# Patient Record
Sex: Female | Born: 1980
Health system: Southern US, Community
[De-identification: ages and names within clinical notes are randomized; demographics above are authoritative.]

## PROBLEM LIST (undated history)

## (undated) DIAGNOSIS — Z8279 Family history of other congenital malformations, deformations and chromosomal abnormalities: Secondary | ICD-10-CM

## (undated) DIAGNOSIS — I499 Cardiac arrhythmia, unspecified: Secondary | ICD-10-CM

## (undated) DIAGNOSIS — F419 Anxiety disorder, unspecified: Secondary | ICD-10-CM

## (undated) DIAGNOSIS — K602 Anal fissure, unspecified: Secondary | ICD-10-CM

## (undated) DIAGNOSIS — G8929 Other chronic pain: Secondary | ICD-10-CM

## (undated) DIAGNOSIS — Z973 Presence of spectacles and contact lenses: Secondary | ICD-10-CM

## (undated) DIAGNOSIS — K625 Hemorrhage of anus and rectum: Secondary | ICD-10-CM

## (undated) DIAGNOSIS — D229 Melanocytic nevi, unspecified: Secondary | ICD-10-CM

## (undated) DIAGNOSIS — O99340 Other mental disorders complicating pregnancy, unspecified trimester: Secondary | ICD-10-CM

## (undated) DIAGNOSIS — Z8619 Personal history of other infectious and parasitic diseases: Secondary | ICD-10-CM

## (undated) DIAGNOSIS — Z8611 Personal history of tuberculosis: Secondary | ICD-10-CM

## (undated) HISTORY — DX: Cardiac arrhythmia, unspecified: I49.9

## (undated) HISTORY — DX: Hemorrhage of anus and rectum: K62.5

## (undated) HISTORY — PX: OTHER SURGICAL HISTORY: SHX169

## (undated) HISTORY — DX: Personal history of other infectious and parasitic diseases: Z86.19

## (undated) HISTORY — DX: Other mental disorders complicating pregnancy, unspecified trimester: O99.340

## (undated) HISTORY — DX: Personal history of tuberculosis: Z86.11

## (undated) HISTORY — PX: CYST EXCISION: SHX5701

## (undated) HISTORY — PX: NO PAST SURGERIES: SHX2092

## (undated) HISTORY — DX: Anxiety disorder, unspecified: F41.9

## (undated) HISTORY — PX: COLONOSCOPY: SHX174

## (undated) HISTORY — DX: Family history of other congenital malformations, deformations and chromosomal abnormalities: Z82.79

---
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
## 2008-04-02 DIAGNOSIS — I499 Cardiac arrhythmia, unspecified: Secondary | ICD-10-CM

## 2008-04-02 DIAGNOSIS — Z8679 Personal history of other diseases of the circulatory system: Secondary | ICD-10-CM

## 2008-04-02 HISTORY — DX: Personal history of other diseases of the circulatory system: Z86.79

## 2008-04-02 HISTORY — PX: COLONOSCOPY: SHX174

## 2008-04-02 HISTORY — DX: Cardiac arrhythmia, unspecified: I49.9

## 2011-02-27 ENCOUNTER — Other Ambulatory Visit: Payer: Self-pay | Admitting: Obstetrics and Gynecology

## 2011-02-28 ENCOUNTER — Encounter (HOSPITAL_COMMUNITY): Payer: Self-pay | Admitting: *Deleted

## 2011-02-28 ENCOUNTER — Encounter (HOSPITAL_COMMUNITY): Payer: Self-pay | Admitting: Pharmacist

## 2011-03-02 ENCOUNTER — Ambulatory Visit (HOSPITAL_COMMUNITY): Payer: BC Managed Care – PPO | Admitting: Anesthesiology

## 2011-03-02 ENCOUNTER — Encounter (HOSPITAL_COMMUNITY): Payer: Self-pay | Admitting: *Deleted

## 2011-03-02 ENCOUNTER — Encounter (HOSPITAL_COMMUNITY): Payer: Self-pay | Admitting: Obstetrics and Gynecology

## 2011-03-02 ENCOUNTER — Ambulatory Visit (HOSPITAL_COMMUNITY)
Admission: RE | Admit: 2011-03-02 | Discharge: 2011-03-02 | Disposition: A | Discharge status: Home / Self Care | Payer: BC Managed Care – PPO | Source: Ambulatory Visit | Attending: Obstetrics and Gynecology | Admitting: Obstetrics and Gynecology

## 2011-03-02 ENCOUNTER — Other Ambulatory Visit: Payer: Self-pay | Admitting: Obstetrics and Gynecology

## 2011-03-02 ENCOUNTER — Encounter (HOSPITAL_COMMUNITY): Payer: Self-pay | Admitting: Anesthesiology

## 2011-03-02 ENCOUNTER — Ambulatory Visit (HOSPITAL_COMMUNITY): Payer: BC Managed Care – PPO

## 2011-03-02 ENCOUNTER — Encounter (HOSPITAL_COMMUNITY)
Admission: RE | Disposition: A | Discharge status: Home / Self Care | Payer: Self-pay | Source: Ambulatory Visit | Attending: Obstetrics and Gynecology

## 2011-03-02 DIAGNOSIS — O021 Missed abortion: Secondary | ICD-10-CM | POA: Diagnosis present

## 2011-03-02 HISTORY — PX: DILATION AND EVACUATION: SHX1459

## 2011-03-02 SURGERY — DILATION AND EVACUATION, UTERUS
Anesthesia: Monitor Anesthesia Care | Site: Vagina | Wound class: Clean Contaminated

## 2011-03-02 MED ORDER — KETOROLAC TROMETHAMINE 60 MG/2ML IM SOLN
INTRAMUSCULAR | Status: AC
  Filled 2011-03-02: qty 2

## 2011-03-02 MED ORDER — KETOROLAC TROMETHAMINE 60 MG/2ML IM SOLN
INTRAMUSCULAR | Status: DC | PRN
  Administered 2011-03-02: 30 mg via INTRAMUSCULAR

## 2011-03-02 MED ORDER — IBUPROFEN 200 MG PO TABS: 600.0000 mg | ORAL_TABLET | Freq: Four times a day (QID) | ORAL | Status: AC

## 2011-03-02 MED ORDER — DEXAMETHASONE SODIUM PHOSPHATE 10 MG/ML IJ SOLN
INTRAMUSCULAR | Status: AC
  Filled 2011-03-02: qty 1

## 2011-03-02 MED ORDER — PROPOFOL 10 MG/ML IV EMUL
INTRAVENOUS | Status: AC
  Filled 2011-03-02: qty 50

## 2011-03-02 MED ORDER — LIDOCAINE HCL (CARDIAC) 20 MG/ML IV SOLN
INTRAVENOUS | Status: AC
  Filled 2011-03-02: qty 5

## 2011-03-02 MED ORDER — FENTANYL CITRATE 0.05 MG/ML IJ SOLN
INTRAMUSCULAR | Status: AC
  Filled 2011-03-02: qty 2

## 2011-03-02 MED ORDER — LIDOCAINE HCL (CARDIAC) 20 MG/ML IV SOLN
INTRAVENOUS | Status: DC | PRN
  Administered 2011-03-02: 80 mg via INTRAVENOUS

## 2011-03-02 MED ORDER — ONDANSETRON HCL 4 MG/2ML IJ SOLN
INTRAMUSCULAR | Status: DC | PRN
  Administered 2011-03-02: 4 mg via INTRAVENOUS

## 2011-03-02 MED ORDER — METHYLERGONOVINE MALEATE 0.2 MG/ML IJ SOLN
INTRAMUSCULAR | Status: DC | PRN
  Administered 2011-03-02: 0.2 mg via INTRAMUSCULAR

## 2011-03-02 MED ORDER — ONDANSETRON HCL 4 MG/2ML IJ SOLN
INTRAMUSCULAR | Status: AC
  Filled 2011-03-02: qty 2

## 2011-03-02 MED ORDER — DEXAMETHASONE SODIUM PHOSPHATE 10 MG/ML IJ SOLN
INTRAMUSCULAR | Status: DC | PRN
  Administered 2011-03-02: 10 mg via INTRAVENOUS

## 2011-03-02 MED ORDER — HYDROCODONE-ACETAMINOPHEN 5-500 MG PO TABS: 1.0000 | ORAL_TABLET | Freq: Four times a day (QID) | ORAL | Status: AC | PRN

## 2011-03-02 MED ORDER — GLYCOPYRROLATE 0.2 MG/ML IJ SOLN
INTRAMUSCULAR | Status: DC | PRN
  Administered 2011-03-02: 0.1 mg via INTRAVENOUS

## 2011-03-02 MED ORDER — HYDROMORPHONE HCL PF 1 MG/ML IJ SOLN: 0.2500 mg | INTRAMUSCULAR | Status: DC | PRN

## 2011-03-02 MED ORDER — KETOROLAC TROMETHAMINE 30 MG/ML IJ SOLN
INTRAMUSCULAR | Status: DC | PRN
  Administered 2011-03-02: 30 mg via INTRAVENOUS

## 2011-03-02 MED ORDER — MIDAZOLAM HCL 2 MG/2ML IJ SOLN
INTRAMUSCULAR | Status: AC
  Filled 2011-03-02: qty 2

## 2011-03-02 MED ORDER — LACTATED RINGERS IV SOLN
INTRAVENOUS | Status: DC
  Administered 2011-03-02 (×2): via INTRAVENOUS

## 2011-03-02 MED ORDER — PROPOFOL 10 MG/ML IV EMUL
INTRAVENOUS | Status: DC | PRN
  Administered 2011-03-02 (×3): 30 mg via INTRAVENOUS
  Administered 2011-03-02: 10 mg via INTRAVENOUS
  Administered 2011-03-02 (×2): 20 mg via INTRAVENOUS

## 2011-03-02 MED ORDER — MIDAZOLAM HCL 5 MG/5ML IJ SOLN
INTRAMUSCULAR | Status: DC | PRN
  Administered 2011-03-02: 2 mg via INTRAVENOUS

## 2011-03-02 MED ORDER — METHYLERGONOVINE MALEATE 0.2 MG/ML IJ SOLN
INTRAMUSCULAR | Status: AC
  Filled 2011-03-02: qty 1

## 2011-03-02 MED ORDER — KETOROLAC TROMETHAMINE 30 MG/ML IJ SOLN: 15.0000 mg | Freq: Once | INTRAMUSCULAR | Status: DC | PRN

## 2011-03-02 MED ORDER — GLYCOPYRROLATE 0.2 MG/ML IJ SOLN
INTRAMUSCULAR | Status: AC
  Filled 2011-03-02: qty 2

## 2011-03-02 MED ORDER — ONDANSETRON HCL 4 MG/2ML IJ SOLN: 4.0000 mg | Freq: Once | INTRAMUSCULAR | Status: DC | PRN

## 2011-03-02 MED ORDER — FENTANYL CITRATE 0.05 MG/ML IJ SOLN
INTRAMUSCULAR | Status: DC | PRN
  Administered 2011-03-02 (×2): 50 ug via INTRAVENOUS
  Administered 2011-03-02: 100 ug via INTRAVENOUS

## 2011-03-02 MED ORDER — LIDOCAINE HCL 2 % IJ SOLN
INTRAMUSCULAR | Status: DC | PRN
  Administered 2011-03-02: 10 mL

## 2011-03-02 SURGICAL SUPPLY — 18 items
CATH ROBINSON RED A/P 16FR (CATHETERS) ×2 IMPLANT
CLOTH BEACON ORANGE TIMEOUT ST (SAFETY) ×2 IMPLANT
DECANTER SPIKE VIAL GLASS SM (MISCELLANEOUS) ×2 IMPLANT
DILATOR CANAL MILEX (MISCELLANEOUS) IMPLANT
GLOVE SURG SS PI 6.5 STRL IVOR (GLOVE) ×4 IMPLANT
GOWN PREVENTION PLUS LG XLONG (DISPOSABLE) ×2 IMPLANT
KIT BERKELEY 1ST TRIMESTER 3/8 (MISCELLANEOUS) ×2 IMPLANT
NEEDLE SPNL 22GX3.5 QUINCKE BK (NEEDLE) ×2 IMPLANT
NS IRRIG 1000ML POUR BTL (IV SOLUTION) ×2 IMPLANT
PACK VAGINAL MINOR WOMEN LF (CUSTOM PROCEDURE TRAY) ×2 IMPLANT
PAD PREP 24X48 CUFFED NSTRL (MISCELLANEOUS) ×2 IMPLANT
SET BERKELEY SUCTION TUBING (SUCTIONS) ×2 IMPLANT
SYR CONTROL 10ML LL (SYRINGE) ×2 IMPLANT
TOWEL OR 17X24 6PK STRL BLUE (TOWEL DISPOSABLE) ×4 IMPLANT
VACURETTE 10 RIGID CVD (CANNULA) IMPLANT
VACURETTE 7MM CVD STRL WRAP (CANNULA) IMPLANT
VACURETTE 8 RIGID CVD (CANNULA) ×2 IMPLANT
VACURETTE 9 RIGID CVD (CANNULA) IMPLANT

## 2011-03-02 NOTE — OR Nursing (Signed)
1245 Husband notified of late OR start per Dr. Pennie Rushing via volunteer.

## 2011-03-02 NOTE — Anesthesia Postprocedure Evaluation (Signed)
Anesthesia Post Note  Patient: Elaine Black  Procedure(s) Performed:  DILATATION AND EVACUATION (D&E) - With intraoperative ultrasound  Anesthesia type: MAC  Patient location: PACU  Post pain: Pain level controlled  Post assessment: Post-op Vital signs reviewed  Last Vitals:  Filed Vitals:   03/02/11 1315  BP: 112/65  Pulse: 85  Temp:   Resp: 18    Post vital signs: Reviewed  Level of consciousness: sedated  Complications: No apparent anesthesia complications

## 2011-03-02 NOTE — Transfer of Care (Signed)
Immediate Anesthesia Transfer of Care Note  Patient: Elaine Black  Procedure(s) Performed:  DILATATION AND EVACUATION (D&E) - With intraoperative ultrasound  Patient Location: PACU  Anesthesia Type: MAC  Level of Consciousness: awake and sedated  Airway & Oxygen Therapy: Patient Spontanous Breathing and Patient connected to nasal cannula oxygen  Post-op Assessment: Report given to PACU RN and Post -op Vital signs reviewed and stable  Post vital signs: Reviewed and stable  Complications: No apparent anesthesia complications

## 2011-03-02 NOTE — Op Note (Addendum)
03/02/2011  1:04 PM  PATIENT:  Elaine Black  30 y.o. female  PRE-OPERATIVE DIAGNOSIS:  missed abortion  POST-OPERATIVE DIAGNOSIS:  missed abortion  PROCEDURE:  Procedure(s): DILATATION AND EVACUATION (D&E)  SURGEON:  Surgeon(s): Hal Morales, MD  ASSISTANTS: none   ANESTHESIA:   IV sedation and paracervical block  ESTIMATED BLOOD LOSS: <50cc COMPLICATIONS: none  FINDINGS:Uterus 10 weeks size moderate amt tissue obtained  BLOOD ADMINISTERED:none  LOCAL MEDICATIONS USED:  LIDOCAINE 10 CC  SPECIMEN:  Source of Specimen:  products of conception  DISPOSITION OF SPECIMEN:  PATHOLOGY  COUNTS:  YES  DESCRIPTION OF PROCEDURE:  The patient was taken the operating room after appropriate identification placed on the operating table. After the attainment of adequate anesthesia she was placed in the lithotomy position. Perineum and vagina were prepped with multiple areas of Betadine and a straight in and out catheter used to empty the bladder. The perineum was draped as a sterile field. A weighted speculum was placed in the posterior vagina and a paracervical block achieved a total of 10 cc of 2% Xylocaine and the 5 and 7:00 positions. A single-tooth tenaculum was placed on the anterior cervix and the cervix dilated to accommodate a number 8 suction catheter. The suction catheter was then used to suction evacuate all quadrants of the uterus. A sharp curet was used to ensure that all products of conception had been removed. All instruments were then removed from the vagina and the patient had her anesthetic reversed and was taken to the recovery room in satisfactory condition having tolerated the procedure well sponge and instrument counts correct.  PLAN OF CARE: Discharge home  PATIENT DISPOSITION:  PACU - hemodynamically stable.   Delay start of Pharmacological VTE agent (>24hrs) due to surgical blood loss or risk of bleeding:  not applicable    HAYGOOD,VANESSA P,  MD 1:04 PM          Blood type A positive

## 2011-03-02 NOTE — H&P (Signed)
Elaine Black is an 30 y.o. female. For treatment of missed abortion  Pertinent Gynecological History: Menses: flow is moderate Bleeding: none since positive pregnancy test Contraception: none DES exposure: unknown Blood transfusions: none Sexually transmitted diseases: no past history Previous GYN Procedures: none\  Last mammogram: na Date:  Last pap: normal Date: not recorded OB History: G1, P0  Menstrual History: Menarche age: 13Patient's last menstrual period was 12/14/2010.    History reviewed. No pertinent past medical history.Benign Juvenile polyps  Past Surgical History  Procedure Date  . Colonoscopy   . Wisdom tooth extraction   . Left ear     removed benign cyst    History reviewed. No pertinent family history.  Social History:  reports that she has never smoked. She has never used smokeless tobacco. She reports that she drinks alcohol. She reports that she does not use illicit drugs.  Allergies:  Allergies  Allergen Reactions  . Demerol Hives    Prescriptions prior to admission  Medication Sig Dispense Refill  . prenatal vitamin w/FE, FA (PRENATAL 1 + 1) 27-1 MG TABS Take 1 tablet by mouth daily.          ROS  Blood pressure 109/76, pulse 92, temperature 98.4 F (36.9 C), temperature source Oral, resp. rate 18, height 5\' 11"  (1.803 m), weight 65.318 kg (144 lb), last menstrual period 12/14/2010, SpO2 100.00%. Physical Exam  Constitutional: She appears well-developed and well-nourished.  Neck: Normal range of motion. Neck supple.  Cardiovascular: Normal rate and regular rhythm.   Respiratory: Effort normal and breath sounds normal.  GI: Soft. There is no tenderness.  Genitourinary: Vagina normal.         No results found for this or any previous visit (from the past 24 hour(s)). Uterus 10 weeeks size  No results found. Ultrasound 11/27 No FHT and no FHM Assessment/Plan: Missed abortion Patient requests D&E  Elaine Black  P 03/02/2011, 11:40 AM

## 2011-03-02 NOTE — Anesthesia Preprocedure Evaluation (Signed)
Anesthesia Evaluation  Patient identified by MRN, date of birth, ID band Patient awake    Reviewed: Allergy & Precautions, H&P , NPO status , Patient's Chart, lab work & pertinent test results  Airway Mallampati: I      Dental No notable dental hx.    Pulmonary neg pulmonary ROS,    Pulmonary exam normal       Cardiovascular neg cardio ROS     Neuro/Psych Negative Neurological ROS  Negative Psych ROS   GI/Hepatic negative GI ROS, Neg liver ROS,   Endo/Other  Negative Endocrine ROS  Renal/GU negative Renal ROS  Genitourinary negative   Musculoskeletal negative musculoskeletal ROS (+)   Abdominal Normal abdominal exam  (+)   Peds negative pediatric ROS (+)  Hematology negative hematology ROS (+)   Anesthesia Other Findings   Reproductive/Obstetrics negative OB ROS                           Anesthesia Physical Anesthesia Plan  ASA: II  Anesthesia Plan: MAC   Post-op Pain Management:    Induction:   Airway Management Planned:   Additional Equipment:   Intra-op Plan:   Post-operative Plan:   Informed Consent: I have reviewed the patients History and Physical, chart, labs and discussed the procedure including the risks, benefits and alternatives for the proposed anesthesia with the patient or authorized representative who has indicated his/her understanding and acceptance.     Plan Discussed with: CRNA  Anesthesia Plan Comments:         Anesthesia Quick Evaluation

## 2011-03-02 NOTE — OR Nursing (Signed)
Intraoperative vaginal  ultrasound started @ 1233. Ended @1236  w/ Dr. Pennie Rushing present and informing pt. Of results.

## 2011-03-02 NOTE — OR Nursing (Signed)
Pt to OR suite. After pt transferred to OR bed she expressed desire for additional ultrasound to make sure no heartbeat present. Dr. Pennie Rushing notified by circulating RN, at which time an intraoperative ultrasound ordered. Ultrasound called @1210  to OR 4.

## 2011-03-02 NOTE — Progress Notes (Signed)
Pt has peri-pad. Scant drainage noted at this time. wil continue to monitor.

## 2011-03-05 ENCOUNTER — Encounter (HOSPITAL_COMMUNITY): Payer: Self-pay | Admitting: Obstetrics and Gynecology

## 2011-08-13 ENCOUNTER — Telehealth: Payer: Self-pay | Admitting: Obstetrics and Gynecology

## 2011-08-13 NOTE — Telephone Encounter (Signed)
Triage received 

## 2011-08-13 NOTE — Telephone Encounter (Signed)
This is SR pt 

## 2011-09-10 ENCOUNTER — Other Ambulatory Visit: Payer: BC Managed Care – PPO

## 2011-09-10 ENCOUNTER — Ambulatory Visit (INDEPENDENT_AMBULATORY_CARE_PROVIDER_SITE_OTHER): Payer: BC Managed Care – PPO

## 2011-09-10 ENCOUNTER — Ambulatory Visit: Payer: Self-pay

## 2011-09-10 VITALS — BP 106/62 | Wt 154.0 lb

## 2011-09-10 DIAGNOSIS — Z331 Pregnant state, incidental: Secondary | ICD-10-CM

## 2011-09-10 DIAGNOSIS — Z8279 Family history of other congenital malformations, deformations and chromosomal abnormalities: Secondary | ICD-10-CM

## 2011-09-10 DIAGNOSIS — O99340 Other mental disorders complicating pregnancy, unspecified trimester: Secondary | ICD-10-CM

## 2011-09-10 DIAGNOSIS — O418X90 Other specified disorders of amniotic fluid and membranes, unspecified trimester, not applicable or unspecified: Secondary | ICD-10-CM

## 2011-09-10 DIAGNOSIS — F411 Generalized anxiety disorder: Secondary | ICD-10-CM

## 2011-09-10 DIAGNOSIS — F419 Anxiety disorder, unspecified: Secondary | ICD-10-CM | POA: Insufficient documentation

## 2011-09-10 DIAGNOSIS — F489 Nonpsychotic mental disorder, unspecified: Secondary | ICD-10-CM

## 2011-09-10 DIAGNOSIS — O09299 Supervision of pregnancy with other poor reproductive or obstetric history, unspecified trimester: Secondary | ICD-10-CM

## 2011-09-10 DIAGNOSIS — O468X9 Other antepartum hemorrhage, unspecified trimester: Secondary | ICD-10-CM | POA: Insufficient documentation

## 2011-09-10 HISTORY — DX: Anxiety disorder, unspecified: F41.9

## 2011-09-10 LAB — POCT URINALYSIS DIPSTICK
Bilirubin, UA: NEGATIVE
Glucose, UA: NEGATIVE
Nitrite, UA: NEGATIVE
Spec Grav, UA: 1.01
Urobilinogen, UA: NEGATIVE

## 2011-09-10 NOTE — Progress Notes (Signed)
NOB work-up today . Pt. Stated she been having some lower left pain, cloudy urine and some discharge . Last 11/2010 wnl

## 2011-09-14 DIAGNOSIS — Z8279 Family history of other congenital malformations, deformations and chromosomal abnormalities: Secondary | ICD-10-CM

## 2011-09-14 HISTORY — DX: Family history of other congenital malformations, deformations and chromosomal abnormalities: Z82.79

## 2011-09-14 NOTE — Progress Notes (Signed)
  Subjective:    Elaine Black is being seen today for her first obstetrical visit.  This is a planned pregnancy. She is at [redacted]w[redacted]d gestation. Her obstetrical history is significant for MAB 11/'12; anxiety r/t previous MAB; FOB w/ only one kidney; h/o irreg HR.Marland Kitchen Relationship with FOB: spouse, living together. Patient does intend to breast feed. Pregnancy history fully reviewed. Pt has "client appt" at 5pm and cannot stay to get labs.    Patient reports intermittent lower abdominal pains; points to LLQ.  Review of Systems:   Review of Systems  Constitutional: Negative.   HENT: Negative.   Eyes: Negative.   Respiratory: Negative.   Cardiovascular: Negative.   Gastrointestinal: Negative.   Genitourinary: Negative.   Neurological: Negative.   Psychiatric/Behavioral: The patient is nervous/anxious.     Objective:     BP 106/62  Wt 154 lb (69.854 kg)  LMP 07/14/2011  Breastfeeding? Unknown Physical Exam  Constitutional: She is oriented to person, place, and time. She appears well-developed and well-nourished. She appears distressed.       Pt tearful and crying several times during exam/appt  HENT:  Head: Normocephalic and atraumatic.  Eyes: Pupils are equal, round, and reactive to light.       glasses  Neck: Normal range of motion. No thyromegaly present.  Cardiovascular: Normal rate and regular rhythm.   Respiratory: Effort normal and breath sounds normal.  GI: Soft. Bowel sounds are normal.  Genitourinary:       deferred  Neurological: She is alert and oriented to person, place, and time.  Skin: Skin is warm and dry.    Maternal Exam:  Abdomen: Patient reports no abdominal tenderness. Introitus: not evaluated.   Cervix: not evaluated.   Fetal Exam Fetal Monitor Review: Mode: ultrasound.   Baseline rate: 148.      Viability u/s today:  SIUP w/ s=d ([redacted]w[redacted]d); FHR=148; YS seen; normal ovaries, adnexa; No fluid in CDS.                                Sm  SCH=1.2x0.9x0.9cm   Assessment:    Pregnancy: [redacted]w[redacted]d; 30y.o. MWF G2P0010 w/ small SCH on u/s today and no VB hx Patient Active Problem List  Diagnosis  . Missed abortion  . Pregnancy with history of abortion  . Family history of congenital anomalies  . Anxiety in pregnancy, antepartum       Plan:    Pt left before reviewing her u/s and before discussing f/u & POC. Did review normal abdominal discomforts in pregnancy, along w/ round ligament pain. Initial labs not yet drawn. Prenatal vitamins. Problem list reviewed and updated. AFP3 discussed: undecided. Role of ultrasound in pregnancy discussed; fetal survey: 1st trimester viability today; anatomy at 19-20 weeks.. Amniocentesis discussed: not indicated. Follow up ASAP for NOB labs; 4  Weeks for ROB, or prn after 10 weeks to hear FHT. Pap due 11/14/11.  [name] H 09/14/2011(late entry: Pt seen 09/10/11)

## 2011-09-18 ENCOUNTER — Other Ambulatory Visit: Payer: BC Managed Care – PPO

## 2011-09-18 DIAGNOSIS — Z331 Pregnant state, incidental: Secondary | ICD-10-CM

## 2011-09-19 LAB — PRENATAL PANEL VII
Antibody Screen: NEGATIVE
Basophils Absolute: 0 10*3/uL (ref 0.0–0.1)
Eosinophils Relative: 0 % (ref 0–5)
HIV: NONREACTIVE
MCH: 31.5 pg (ref 26.0–34.0)
MCHC: 33.2 g/dL (ref 30.0–36.0)
Neutrophils Relative %: 77 % (ref 43–77)
Platelets: 206 10*3/uL (ref 150–400)
RDW: 13.1 % (ref 11.5–15.5)
Rh Type: POSITIVE
Rubella: 191.5 IU/mL — ABNORMAL HIGH

## 2011-09-20 ENCOUNTER — Other Ambulatory Visit: Payer: Self-pay | Admitting: Obstetrics and Gynecology

## 2011-09-20 LAB — CULTURE, OB URINE: Colony Count: NO GROWTH

## 2011-09-25 ENCOUNTER — Ambulatory Visit (INDEPENDENT_AMBULATORY_CARE_PROVIDER_SITE_OTHER): Payer: BC Managed Care – PPO | Admitting: Obstetrics and Gynecology

## 2011-09-25 VITALS — BP 100/60 | Wt 152.0 lb

## 2011-09-25 DIAGNOSIS — Z331 Pregnant state, incidental: Secondary | ICD-10-CM

## 2011-09-25 NOTE — Progress Notes (Signed)
Pt is worried since she lost her last baby at 10 wk  She wants to hear FHT. I made pt aware Dr.Stringer will have to do her FHT's. Pt states her husband was afraid to come to today's visit.

## 2011-09-25 NOTE — Progress Notes (Signed)
Doing well.  Anxious. First trimester screen in 2 weeks. Dr. Stefano Gaul

## 2011-10-08 ENCOUNTER — Encounter: Payer: BC Managed Care – PPO | Admitting: Obstetrics and Gynecology

## 2011-10-08 ENCOUNTER — Other Ambulatory Visit: Payer: BC Managed Care – PPO

## 2011-10-09 ENCOUNTER — Other Ambulatory Visit: Payer: BC Managed Care – PPO

## 2011-10-09 ENCOUNTER — Other Ambulatory Visit: Payer: Self-pay | Admitting: Obstetrics and Gynecology

## 2011-10-09 DIAGNOSIS — Z36 Encounter for antenatal screening of mother: Secondary | ICD-10-CM

## 2011-10-10 ENCOUNTER — Ambulatory Visit (INDEPENDENT_AMBULATORY_CARE_PROVIDER_SITE_OTHER): Payer: BC Managed Care – PPO | Admitting: Obstetrics and Gynecology

## 2011-10-10 ENCOUNTER — Ambulatory Visit (INDEPENDENT_AMBULATORY_CARE_PROVIDER_SITE_OTHER): Payer: BC Managed Care – PPO

## 2011-10-10 ENCOUNTER — Other Ambulatory Visit: Payer: Self-pay | Admitting: Obstetrics and Gynecology

## 2011-10-10 ENCOUNTER — Encounter: Payer: Self-pay | Admitting: Obstetrics and Gynecology

## 2011-10-10 VITALS — BP 100/68 | Wt 152.0 lb

## 2011-10-10 DIAGNOSIS — Z36 Encounter for antenatal screening of mother: Secondary | ICD-10-CM

## 2011-10-10 DIAGNOSIS — Z331 Pregnant state, incidental: Secondary | ICD-10-CM

## 2011-10-10 NOTE — Progress Notes (Signed)
[redacted]w[redacted]d  Doing well, feels reassured after having [place] today, nervous about hx MAB (had told family and then came for visit w no FHT's).  AUA = [redacted]w[redacted]d EDC 04/14/12 - best [facility] 04/20/11  Posterior placenta +YS NT=2.0  cx closed  Nl ov/adenxa  rv'd healthy eating habits, inc protein and frequency of eating, at least 64oz of H20 daily States nausea is improving, but doesn't have much appetite Pt reports hx of severe motion sickness and is planning travel and would like to take dramamine - rv'd preg cat B  No other c/o  F/u 4wks - plan AFP

## 2011-10-10 NOTE — Progress Notes (Signed)
Pt wants to know how to gain healthy weight   Pt ?'s is Dramamine & Bonine safe in pregnancy c/o motion sickness

## 2011-11-06 ENCOUNTER — Ambulatory Visit (INDEPENDENT_AMBULATORY_CARE_PROVIDER_SITE_OTHER): Payer: BC Managed Care – PPO | Admitting: Obstetrics and Gynecology

## 2011-11-06 VITALS — BP 100/62 | Wt 158.0 lb

## 2011-11-06 DIAGNOSIS — Z1379 Encounter for other screening for genetic and chromosomal anomalies: Secondary | ICD-10-CM

## 2011-11-06 DIAGNOSIS — Z3490 Encounter for supervision of normal pregnancy, unspecified, unspecified trimester: Secondary | ICD-10-CM

## 2011-11-06 DIAGNOSIS — Z3689 Encounter for other specified antenatal screening: Secondary | ICD-10-CM

## 2011-11-06 DIAGNOSIS — Z331 Pregnant state, incidental: Secondary | ICD-10-CM

## 2011-11-06 NOTE — Progress Notes (Signed)
[redacted]w[redacted]d No complaints. AFP today. F/U for Anatomy USS  @ [redacted]w[redacted]d approx.

## 2011-11-08 LAB — ALPHA FETOPROTEIN, MATERNAL
AFP: 45.1 IU/mL
MoM for AFP: 1.48
Open Spina bifida: NEGATIVE

## 2011-11-27 ENCOUNTER — Ambulatory Visit (INDEPENDENT_AMBULATORY_CARE_PROVIDER_SITE_OTHER): Payer: BC Managed Care – PPO

## 2011-11-27 ENCOUNTER — Other Ambulatory Visit: Payer: Self-pay | Admitting: Obstetrics and Gynecology

## 2011-11-27 ENCOUNTER — Ambulatory Visit (INDEPENDENT_AMBULATORY_CARE_PROVIDER_SITE_OTHER): Payer: BC Managed Care – PPO | Admitting: Obstetrics and Gynecology

## 2011-11-27 VITALS — BP 102/56 | Wt 160.0 lb

## 2011-11-27 DIAGNOSIS — Z3689 Encounter for other specified antenatal screening: Secondary | ICD-10-CM

## 2011-11-27 DIAGNOSIS — Z331 Pregnant state, incidental: Secondary | ICD-10-CM

## 2011-11-27 LAB — US OB COMP + 14 WK

## 2011-11-27 NOTE — Progress Notes (Signed)
Sono:  EFW 11oz +/- 0.95 oz AUA 49th%tile  CX:  3.92cm   Breech presentation.  Posterior placenta.  No previa.  Normal fluid.  AP pocket = 3.8   Normal linear growth.  No fetal abnormality is seen.  Open hands, 5th digit seen.  Female gender.   cx closed. Normal adnexa.

## 2011-11-27 NOTE — Progress Notes (Signed)
Pt stated no issues today.  

## 2011-11-27 NOTE — Progress Notes (Signed)
[redacted]w[redacted]d Normal anatomy ultrasound today AFP normal

## 2011-12-26 ENCOUNTER — Encounter: Payer: Self-pay | Admitting: Obstetrics and Gynecology

## 2011-12-26 ENCOUNTER — Ambulatory Visit (INDEPENDENT_AMBULATORY_CARE_PROVIDER_SITE_OTHER): Payer: BC Managed Care – PPO | Admitting: Obstetrics and Gynecology

## 2011-12-26 VITALS — BP 100/60 | Wt 168.0 lb

## 2011-12-26 DIAGNOSIS — Z331 Pregnant state, incidental: Secondary | ICD-10-CM

## 2011-12-26 DIAGNOSIS — Z3490 Encounter for supervision of normal pregnancy, unspecified, unspecified trimester: Secondary | ICD-10-CM

## 2011-12-26 NOTE — Progress Notes (Signed)
[redacted]w[redacted]d No concerns today per pt

## 2011-12-26 NOTE — Progress Notes (Signed)
Doing well. Reviewed RLP comfort measures, CB education, and plan for glucola, Hgb, RPR at [facility].

## 2012-01-09 ENCOUNTER — Ambulatory Visit (INDEPENDENT_AMBULATORY_CARE_PROVIDER_SITE_OTHER): Payer: BC Managed Care – PPO | Admitting: Obstetrics and Gynecology

## 2012-01-09 ENCOUNTER — Encounter: Payer: Self-pay | Admitting: Obstetrics and Gynecology

## 2012-01-09 ENCOUNTER — Telehealth: Payer: Self-pay | Admitting: Obstetrics and Gynecology

## 2012-01-09 VITALS — BP 118/60 | Wt 171.5 lb

## 2012-01-09 DIAGNOSIS — R109 Unspecified abdominal pain: Secondary | ICD-10-CM

## 2012-01-09 DIAGNOSIS — R1030 Lower abdominal pain, unspecified: Secondary | ICD-10-CM

## 2012-01-09 LAB — FETAL FIBRONECTIN: Fetal Fibronectin: NEGATIVE

## 2012-01-09 LAB — POCT WET PREP (WET MOUNT)
Bacteria Wet Prep HPF POC: NEGATIVE
Trichomonas Wet Prep HPF POC: NEGATIVE
WBC, Wet Prep HPF POC: NEGATIVE
pH: 4.5

## 2012-01-09 LAB — POCT URINALYSIS DIPSTICK
Bilirubin, UA: NEGATIVE
Glucose, UA: NEGATIVE
Nitrite, UA: NEGATIVE
Spec Grav, UA: 1.015
Urobilinogen, UA: NEGATIVE

## 2012-01-09 MED ORDER — METRONIDAZOLE 500 MG PO TABS: ORAL_TABLET | ORAL | Status: DC

## 2012-01-09 MED ORDER — TERCONAZOLE 0.4 % VA CREA: TOPICAL_CREAM | VAGINAL | Status: DC

## 2012-01-09 NOTE — Progress Notes (Signed)
105w4d Work in pt c/o low back & abdominal pain C/o ctx's

## 2012-01-09 NOTE — Telephone Encounter (Signed)
TC to patient to review NEGATIVE FFN results. Seen in office today at 25 4/7 weeks for cramping. Patient may want FFN repeated at [facility] in 2 weeks for reassurance. Will call with any questions or concerns.

## 2012-01-09 NOTE — Patient Instructions (Signed)
Preterm Labor Preterm labor is when labor starts at less than 37 weeks of pregnancy. The normal length of a pregnancy is 39 to 41 weeks. CAUSES Often, there is no identifiable underlying cause as to why a woman goes into preterm labor. However, one of the most common known causes of preterm labor is infection. Infections of the uterus, cervix, vagina, amniotic sac, bladder, kidney, or even the lungs (pneumonia) can cause labor to start. Other causes of preterm labor include:  Urogenital infections, such as yeast infections and bacterial vaginosis.  Uterine abnormalities (uterine shape, uterine septum, fibroids, bleeding from the placenta).  A cervix that has been operated on and opens prematurely.  Malformations in the baby.  Multiple gestations (twins, triplets, and so on).  Breakage of the amniotic sac. Additional risk factors for preterm labor include:  Previous history of preterm labor.  Premature rupture of membranes (PROM).  A placenta that covers the opening of the cervix (placenta previa).  A placenta that separates from the uterus (placenta abruption).  A cervix that is too weak to hold the baby in the uterus (incompetence cervix).  Having too much fluid in the amniotic sac (polyhydramnios).  Taking illegal drugs or smoking while pregnant.  Not gaining enough weight while pregnant.  Women younger than 71 and older than 31 years old.  Low socioeconomic status.  African-American ethnicity. SYMPTOMS Signs and symptoms of preterm labor include:  Menstrual-like cramps.  Contractions that are 30 to 70 seconds apart, become very regular, closer together, and are more intense and painful.  Contractions that start on the top of the uterus and spread down to the lower abdomen and back.  A sense of increased pelvic pressure or back pain.  A watery or bloody discharge that comes from the vagina. DIAGNOSIS  A diagnosis can be confirmed by:  A vaginal exam.  An  ultrasound of the cervix.  Sampling (swabbing) cervico-vaginal secretions. These samples can be tested for the presence of fetal fibronectin. This is a protein found in cervical discharge which is associated with preterm labor.  Fetal monitoring. TREATMENT  Depending on the length of the pregnancy and other circumstances, a caregiver may suggest bed rest. If necessary, there are medicines that can be given to stop contractions and to quicken fetal lung maturity. If labor happens before 34 weeks of pregnancy, a prolonged hospital stay may be recommended. Treatment depends on the condition of both the mother and baby. PREVENTION There are some things a mother can do to lower the risk of preterm labor in future pregnancies. A woman can:   Stop smoking.  Maintain healthy weight gain and avoid chemicals and drugs that are not necessary.  Be watchful for any type of infection.  Inform her caregiver if she has a known history of preterm labor. Document Released: 06/09/2003 Document Revised: 06/11/2011 Document Reviewed: 07/14/2010 [facility] Patient Information 2013 [place], [place]. Bacterial Vaginosis Bacterial vaginosis (BV) is a vaginal infection where the normal balance of bacteria in the vagina is disrupted. The normal balance is then replaced by an overgrowth of certain bacteria. There are several different kinds of bacteria that can cause BV. BV is the most common vaginal infection in women of childbearing age. CAUSES   The cause of BV is not fully understood. BV develops when there is an increase or imbalance of harmful bacteria.  Some activities or behaviors can upset the normal balance of bacteria in the vagina and put women at increased risk including:  Having a new sex  partner or multiple sex partners.  Douching.  Using an intrauterine device (IUD) for contraception.  It is not clear what role sexual activity plays in the development of BV. However, women that have never had  sexual intercourse are rarely infected with BV. Women do not get BV from toilet seats, bedding, swimming pools or from touching objects around them.  SYMPTOMS   Grey vaginal discharge.  A fish-like odor with discharge, especially after sexual intercourse.  Itching or burning of the vagina and vulva.  Burning or pain with urination.  Some women have no signs or symptoms at all. DIAGNOSIS  Your caregiver must examine the vagina for signs of BV. Your caregiver will perform lab tests and look at the sample of vaginal fluid through a microscope. They will look for bacteria and abnormal cells (clue cells), a pH test higher than 4.5, and a positive amine test all associated with BV.  RISKS AND COMPLICATIONS   Pelvic inflammatory disease (PID).  Infections following gynecology surgery.  Developing HIV.  Developing herpes virus. TREATMENT  Sometimes BV will clear up without treatment. However, all women with symptoms of BV should be treated to avoid complications, especially if gynecology surgery is planned. Female partners generally do not need to be treated. However, BV may spread between female sex partners so treatment is helpful in preventing a recurrence of BV.   BV may be treated with antibiotics. The antibiotics come in either pill or vaginal cream forms. Either can be used with nonpregnant or pregnant women, but the recommended dosages differ. These antibiotics are not harmful to the baby.  BV can recur after treatment. If this happens, a second round of antibiotics will often be prescribed.  Treatment is important for pregnant women. If not treated, BV can cause a premature delivery, especially for a pregnant woman who had a premature birth in the past. All pregnant women who have symptoms of BV should be checked and treated.  For chronic reoccurrence of BV, treatment with a type of prescribed gel vaginally twice a week is helpful. HOME CARE INSTRUCTIONS   Finish all medication as  directed by your caregiver.  Do not have sex until treatment is completed.  Tell your sexual partner that you have a vaginal infection. They should see their caregiver and be treated if they have problems, such as a mild rash or itching.  Practice safe sex. Use condoms. Only have 1 sex partner. PREVENTION  Basic prevention steps can help reduce the risk of upsetting the natural balance of bacteria in the vagina and developing BV:  Do not have sexual intercourse (be abstinent).  Do not douche.  Use all of the medicine prescribed for treatment of BV, even if the signs and symptoms go away.  Tell your sex partner if you have BV. That way, they can be treated, if needed, to prevent reoccurrence. SEEK MEDICAL CARE IF:   Your symptoms are not improving after 3 days of treatment.  You have increased discharge, pain, or fever. MAKE SURE YOU:   Understand these instructions.  Will watch your condition.  Will get help right away if you are not doing well or get worse. FOR MORE INFORMATION  Division of STD Prevention (DSTDP), Centers for Disease Control and Prevention: [employer] American Social Health Association (ASHA): www.ashastd.org  Document Released: 03/19/2005 Document Revised: 06/11/2011 Document Reviewed: 09/09/2008 [facility] Patient Information 2013 [place], [place].

## 2012-01-09 NOTE — Telephone Encounter (Signed)
Pt called, is 25 wks 4 days, states last pm had about 4 ctxs between 12-1028, but denies any of that going on this am, also has more d/c that is thicker and more increased, can feel the d/c as its expelling. Pt denies any bldg or clear watery leakage.  Pt was able to sleep through the night.  Pt offered an appt to come in for eval or advised to hydrate well, if gets 6 ctxs in an hour whether painful or not to call any time day or night, pt does have +fm, pt voices agreement to all, will monitor and will call back with any concerns.

## 2012-01-09 NOTE — Progress Notes (Signed)
Patient ID: Liel Rudden, female   DOB: [birth year], 31 y.o.   MRN: [id number] [redacted]w[redacted]d C/o of round ligament pain LLQ to R midline last pm and today some of the same, drinking plenty of water no vag irritation with usual vag discharge but increased, +FM, no change in activites. abdomen nontender, Normal hair distrubition mons pubis,  EGBUS WNL, sterile speculum exam,  vagina pink, moist normal rugae, thick white to yellow discharge cerix LTC GC/CHL sent FFN sent CNM called will call results to pt WET PREP VVC BV A VVC    BV P discussed RX flagyl      terazol 7 ..     Baking soda bathes x20 minutes BID    Home to rest no intercourse x 7 days    Reviewed s/s preterm labor, srom, vag bleeding,daily kick counts to report, encouraged 8 water daily and frequent voids. Lavera Guise, CNM

## 2012-01-09 NOTE — Telephone Encounter (Signed)
Pt called, states has started with the sxs again as discussed earlier today, pt requests appt for today, pt worked in w/ [facility] today @ 1445 for eval.

## 2012-01-10 LAB — GC/CHLAMYDIA PROBE AMP, GENITAL
Chlamydia, DNA Probe: NEGATIVE
GC Probe Amp, Genital: NEGATIVE

## 2012-01-23 ENCOUNTER — Encounter: Payer: Self-pay | Admitting: Obstetrics and Gynecology

## 2012-01-23 ENCOUNTER — Other Ambulatory Visit: Payer: BC Managed Care – PPO

## 2012-01-23 ENCOUNTER — Ambulatory Visit (INDEPENDENT_AMBULATORY_CARE_PROVIDER_SITE_OTHER): Payer: BC Managed Care – PPO | Admitting: Obstetrics and Gynecology

## 2012-01-23 VITALS — BP 110/70 | Wt 175.0 lb

## 2012-01-23 DIAGNOSIS — O26899 Other specified pregnancy related conditions, unspecified trimester: Secondary | ICD-10-CM

## 2012-01-23 DIAGNOSIS — N898 Other specified noninflammatory disorders of vagina: Secondary | ICD-10-CM

## 2012-01-23 DIAGNOSIS — Z331 Pregnant state, incidental: Secondary | ICD-10-CM

## 2012-01-23 LAB — POCT WET PREP (WET MOUNT)
Clue Cells Wet Prep Whiff POC: NEGATIVE
pH: 3.5

## 2012-01-23 NOTE — Progress Notes (Signed)
Has 1 cm mole (patient reports it is a birthmark) on right inner thigh.  Would like it removed at delivery. Will note patient's request--will consult with MD prn at that time for assistance, if needed. Mole appears to be on short stalk, demarcated from underlying tissue. Will require local infiltration, scalpel to remove, and likely a few sutures at removal site, with specimen sent to path for evaluation.

## 2012-01-23 NOTE — Progress Notes (Signed)
[redacted]w[redacted]d 1 gtt given today without difficulty  Pt wants to recheck discharge. BV was found last visit but didn't have sx's.

## 2012-01-23 NOTE — Progress Notes (Signed)
BV resolved on wet prep. Glucola today, with Hgb and RPR. A+ type.

## 2012-01-24 LAB — RPR

## 2012-01-24 LAB — GLUCOSE TOLERANCE, 1 HOUR (50G) W/O FASTING: Glucose, 1 Hour GTT: 90 mg/dL (ref 70–140)

## 2012-02-04 ENCOUNTER — Encounter: Payer: BC Managed Care – PPO | Admitting: Obstetrics and Gynecology

## 2012-02-05 ENCOUNTER — Encounter: Payer: Self-pay | Admitting: Obstetrics and Gynecology

## 2012-02-05 ENCOUNTER — Ambulatory Visit (INDEPENDENT_AMBULATORY_CARE_PROVIDER_SITE_OTHER): Payer: BC Managed Care – PPO | Admitting: Obstetrics and Gynecology

## 2012-02-05 VITALS — BP 112/58 | Wt 180.0 lb

## 2012-02-05 DIAGNOSIS — N898 Other specified noninflammatory disorders of vagina: Secondary | ICD-10-CM

## 2012-02-05 DIAGNOSIS — Z23 Encounter for immunization: Secondary | ICD-10-CM

## 2012-02-05 DIAGNOSIS — F411 Generalized anxiety disorder: Secondary | ICD-10-CM

## 2012-02-05 DIAGNOSIS — O26899 Other specified pregnancy related conditions, unspecified trimester: Secondary | ICD-10-CM

## 2012-02-05 DIAGNOSIS — O99340 Other mental disorders complicating pregnancy, unspecified trimester: Secondary | ICD-10-CM

## 2012-02-05 DIAGNOSIS — O09299 Supervision of pregnancy with other poor reproductive or obstetric history, unspecified trimester: Secondary | ICD-10-CM

## 2012-02-05 LAB — POCT WET PREP (WET MOUNT)

## 2012-02-05 NOTE — Progress Notes (Signed)
[redacted]w[redacted]d  Pt c/o of thicker vaginal discharge for the last week. Pt states no itching or burning .  We prep shows small number budding yeast.  Rec daily yogurt.  Will rx with Terazol 7 only if pt develops sx. Pt requests flu vaccine : done 1 hr glu=90 RPR neg

## 2012-02-19 ENCOUNTER — Encounter: Payer: Self-pay | Admitting: Obstetrics and Gynecology

## 2012-02-19 ENCOUNTER — Ambulatory Visit (INDEPENDENT_AMBULATORY_CARE_PROVIDER_SITE_OTHER): Payer: BC Managed Care – PPO | Admitting: Obstetrics and Gynecology

## 2012-02-19 VITALS — BP 102/62 | Wt 180.5 lb

## 2012-02-19 DIAGNOSIS — Z331 Pregnant state, incidental: Secondary | ICD-10-CM

## 2012-02-19 NOTE — Patient Instructions (Signed)
Fetal Movement Counts Patient Name: __________________________________________________ Patient Due Date: ____________________ Kick counts is highly recommended in high risk pregnancies, but it is a good idea for every pregnant woman to do. Start counting fetal movements at 28 weeks of the pregnancy. Fetal movements increase after eating a full meal or eating or drinking something sweet (the blood sugar is higher). It is also important to drink plenty of fluids (well hydrated) before doing the count. Lie on your left side because it helps with the circulation or you can sit in a comfortable chair with your arms over your belly (abdomen) with no distractions around you. DOING THE COUNT  Try to do the count the same time of day each time you do it.  Mark the day and time, then see how long it takes for you to feel 10 movements (kicks, flutters, swishes, rolls). You should have at least 10 movements within 2 hours. You will most likely feel 10 movements in much less than 2 hours. If you do not, wait an hour and count again. After a couple of days you will see a pattern.  What you are looking for is a change in the pattern or not enough counts in 2 hours. Is it taking longer in time to reach 10 movements? SEEK MEDICAL CARE IF:  You feel less than 10 counts in 2 hours. Tried twice.  No movement in one hour.  The pattern is changing or taking longer each day to reach 10 counts in 2 hours.  You feel the baby is not moving as it usually does. Date: ____________ Movements: ____________ Start time: ____________ Finish time: ____________  Date: ____________ Movements: ____________ Start time: ____________ Finish time: ____________ Date: ____________ Movements: ____________ Start time: ____________ Finish time: ____________ Date: ____________ Movements: ____________ Start time: ____________ Finish time: ____________ Date: ____________ Movements: ____________ Start time: ____________ Finish time:  ____________ Date: ____________ Movements: ____________ Start time: ____________ Finish time: ____________ Date: ____________ Movements: ____________ Start time: ____________ Finish time: ____________ Date: ____________ Movements: ____________ Start time: ____________ Finish time: ____________  Date: ____________ Movements: ____________ Start time: ____________ Finish time: ____________ Date: ____________ Movements: ____________ Start time: ____________ Finish time: ____________ Date: ____________ Movements: ____________ Start time: ____________ Finish time: ____________ Date: ____________ Movements: ____________ Start time: ____________ Finish time: ____________ Date: ____________ Movements: ____________ Start time: ____________ Finish time: ____________ Date: ____________ Movements: ____________ Start time: ____________ Finish time: ____________ Date: ____________ Movements: ____________ Start time: ____________ Finish time: ____________  Date: ____________ Movements: ____________ Start time: ____________ Finish time: ____________ Date: ____________ Movements: ____________ Start time: ____________ Finish time: ____________ Date: ____________ Movements: ____________ Start time: ____________ Finish time: ____________ Date: ____________ Movements: ____________ Start time: ____________ Finish time: ____________ Date: ____________ Movements: ____________ Start time: ____________ Finish time: ____________ Date: ____________ Movements: ____________ Start time: ____________ Finish time: ____________ Date: ____________ Movements: ____________ Start time: ____________ Finish time: ____________  Date: ____________ Movements: ____________ Start time: ____________ Finish time: ____________ Date: ____________ Movements: ____________ Start time: ____________ Finish time: ____________ Date: ____________ Movements: ____________ Start time: ____________ Finish time: ____________ Date: ____________ Movements:  ____________ Start time: ____________ Finish time: ____________ Date: ____________ Movements: ____________ Start time: ____________ Finish time: ____________ Date: ____________ Movements: ____________ Start time: ____________ Finish time: ____________ Date: ____________ Movements: ____________ Start time: ____________ Finish time: ____________  Date: ____________ Movements: ____________ Start time: ____________ Finish time: ____________ Date: ____________ Movements: ____________ Start time: ____________ Finish time: ____________ Date: ____________ Movements: ____________ Start time: ____________ Finish time: ____________ Date: ____________ Movements:   ____________ Start time: ____________ Finish time: ____________ Date: ____________ Movements: ____________ Start time: ____________ Finish time: ____________ Date: ____________ Movements: ____________ Start time: ____________ Finish time: ____________ Date: ____________ Movements: ____________ Start time: ____________ Finish time: ____________  Date: ____________ Movements: ____________ Start time: ____________ Finish time: ____________ Date: ____________ Movements: ____________ Start time: ____________ Finish time: ____________ Date: ____________ Movements: ____________ Start time: ____________ Finish time: ____________ Date: ____________ Movements: ____________ Start time: ____________ Finish time: ____________ Date: ____________ Movements: ____________ Start time: ____________ Finish time: ____________ Date: ____________ Movements: ____________ Start time: ____________ Finish time: ____________ Date: ____________ Movements: ____________ Start time: ____________ Finish time: ____________  Date: ____________ Movements: ____________ Start time: ____________ Finish time: ____________ Date: ____________ Movements: ____________ Start time: ____________ Finish time: ____________ Date: ____________ Movements: ____________ Start time: ____________ Finish  time: ____________ Date: ____________ Movements: ____________ Start time: ____________ Finish time: ____________ Date: ____________ Movements: ____________ Start time: ____________ Finish time: ____________ Date: ____________ Movements: ____________ Start time: ____________ Finish time: ____________ Date: ____________ Movements: ____________ Start time: ____________ Finish time: ____________  Date: ____________ Movements: ____________ Start time: ____________ Finish time: ____________ Date: ____________ Movements: ____________ Start time: ____________ Finish time: ____________ Date: ____________ Movements: ____________ Start time: ____________ Finish time: ____________ Date: ____________ Movements: ____________ Start time: ____________ Finish time: ____________ Date: ____________ Movements: ____________ Start time: ____________ Finish time: ____________ Date: ____________ Movements: ____________ Start time: ____________ Finish time: ____________ Document Released: 04/18/2006 Document Revised: 06/11/2011 Document Reviewed: 10/19/2008 [employer] Patient Information 2013 [employer].  

## 2012-02-19 NOTE — Progress Notes (Signed)
[redacted]w[redacted]d Unsure pt is vtx by [facility] will do [place] @[place]  [facility] reviewed

## 2012-02-19 NOTE — Progress Notes (Signed)
Pt w/o complaint, flu vaccine at last visit.

## 2012-03-04 ENCOUNTER — Ambulatory Visit (INDEPENDENT_AMBULATORY_CARE_PROVIDER_SITE_OTHER): Payer: BC Managed Care – PPO | Admitting: Obstetrics and Gynecology

## 2012-03-04 ENCOUNTER — Encounter: Payer: Self-pay | Admitting: Obstetrics and Gynecology

## 2012-03-04 ENCOUNTER — Ambulatory Visit (INDEPENDENT_AMBULATORY_CARE_PROVIDER_SITE_OTHER): Payer: BC Managed Care – PPO

## 2012-03-04 VITALS — BP 122/76 | Wt 182.0 lb

## 2012-03-04 DIAGNOSIS — Z331 Pregnant state, incidental: Secondary | ICD-10-CM

## 2012-03-04 DIAGNOSIS — O320XX0 Maternal care for unstable lie, not applicable or unspecified: Secondary | ICD-10-CM

## 2012-03-04 NOTE — Progress Notes (Signed)
[redacted]w[redacted]d  Pt has no complaints

## 2012-03-04 NOTE — Progress Notes (Signed)
[redacted]w[redacted]d GFM No complaints Ultrasound: AFI at 11.2  30%

## 2012-03-07 LAB — US OB LIMITED

## 2012-03-18 ENCOUNTER — Encounter: Payer: Self-pay | Admitting: Obstetrics and Gynecology

## 2012-03-18 ENCOUNTER — Ambulatory Visit (INDEPENDENT_AMBULATORY_CARE_PROVIDER_SITE_OTHER): Payer: BC Managed Care – PPO | Admitting: Obstetrics and Gynecology

## 2012-03-18 VITALS — BP 110/62 | Wt 185.0 lb

## 2012-03-18 DIAGNOSIS — Z331 Pregnant state, incidental: Secondary | ICD-10-CM

## 2012-03-18 DIAGNOSIS — O321XX0 Maternal care for breech presentation, not applicable or unspecified: Secondary | ICD-10-CM

## 2012-03-18 NOTE — Progress Notes (Signed)
[redacted]w[redacted]d Beta strep today. Ultrasound in 2 weeks because of breech infant.  Version discussed. Rash on her hands. Dr. Stefano Gaul

## 2012-03-18 NOTE — Progress Notes (Signed)
[redacted]w[redacted]d pt c/o: cluster of scabs on hands and stomach unsure if this is a concerns x 1 month.  Pt needs a dental note for Friday.  GBS today

## 2012-03-18 NOTE — Addendum Note (Signed)
Addended by: Tim Lair on: 03/18/2012 10:15 AM   Modules accepted: Orders

## 2012-03-27 ENCOUNTER — Ambulatory Visit (INDEPENDENT_AMBULATORY_CARE_PROVIDER_SITE_OTHER): Payer: BC Managed Care – PPO | Admitting: Obstetrics and Gynecology

## 2012-03-27 VITALS — BP 110/68 | Wt 189.0 lb

## 2012-03-27 DIAGNOSIS — O321XX0 Maternal care for breech presentation, not applicable or unspecified: Secondary | ICD-10-CM | POA: Insufficient documentation

## 2012-03-27 NOTE — Progress Notes (Signed)
Doing well--feels baby is still breech. Declines external version, but would like to schedule C/S as close to 40 weeks as possible to allow baby time to have opportunity to turn (but realizes less likely every day). Has ROB and [place] with [place] 04/01/12--would prefer to schedule C/S with AVS or SR. Reviewed +GBS (was positive with previous pregnancy). Breech still today by [facility], with vtx in [facility]Q.

## 2012-03-27 NOTE — Progress Notes (Signed)
[redacted]w[redacted]d  Pt has no concerns today

## 2012-04-01 ENCOUNTER — Ambulatory Visit (INDEPENDENT_AMBULATORY_CARE_PROVIDER_SITE_OTHER): Payer: BC Managed Care – PPO | Admitting: Obstetrics and Gynecology

## 2012-04-01 ENCOUNTER — Ambulatory Visit (INDEPENDENT_AMBULATORY_CARE_PROVIDER_SITE_OTHER): Payer: BC Managed Care – PPO

## 2012-04-01 ENCOUNTER — Encounter: Payer: Self-pay | Admitting: Obstetrics and Gynecology

## 2012-04-01 VITALS — BP 112/66 | Wt 187.0 lb

## 2012-04-01 DIAGNOSIS — O321XX0 Maternal care for breech presentation, not applicable or unspecified: Secondary | ICD-10-CM

## 2012-04-01 DIAGNOSIS — Z331 Pregnant state, incidental: Secondary | ICD-10-CM

## 2012-04-01 NOTE — Patient Instructions (Signed)
Fetal Movement Counts Patient Name: __________________________________________________ Patient Due Date: ____________________ Kick counts is highly recommended in high risk pregnancies, but it is a good idea for every pregnant woman to do. Start counting fetal movements at 28 weeks of the pregnancy. Fetal movements increase after eating a full meal or eating or drinking something sweet (the blood sugar is higher). It is also important to drink plenty of fluids (well hydrated) before doing the count. Lie on your left side because it helps with the circulation or you can sit in a comfortable chair with your arms over your belly (abdomen) with no distractions around you. DOING THE COUNT  Try to do the count the same time of day each time you do it.  Mark the day and time, then see how long it takes for you to feel 10 movements (kicks, flutters, swishes, rolls). You should have at least 10 movements within 2 hours. You will most likely feel 10 movements in much less than 2 hours. If you do not, wait an hour and count again. After a couple of days you will see a pattern.  What you are looking for is a change in the pattern or not enough counts in 2 hours. Is it taking longer in time to reach 10 movements? SEEK MEDICAL CARE IF:  You feel less than 10 counts in 2 hours. Tried twice.  No movement in one hour.  The pattern is changing or taking longer each day to reach 10 counts in 2 hours.  You feel the baby is not moving as it usually does. Date: ____________ Movements: ____________ Start time: ____________ Finish time: ____________  Date: ____________ Movements: ____________ Start time: ____________ Finish time: ____________ Date: ____________ Movements: ____________ Start time: ____________ Finish time: ____________ Date: ____________ Movements: ____________ Start time: ____________ Finish time: ____________ Date: ____________ Movements: ____________ Start time: ____________ Finish time:  ____________ Date: ____________ Movements: ____________ Start time: ____________ Finish time: ____________ Date: ____________ Movements: ____________ Start time: ____________ Finish time: ____________ Date: ____________ Movements: ____________ Start time: ____________ Finish time: ____________  Date: ____________ Movements: ____________ Start time: ____________ Finish time: ____________ Date: ____________ Movements: ____________ Start time: ____________ Finish time: ____________ Date: ____________ Movements: ____________ Start time: ____________ Finish time: ____________ Date: ____________ Movements: ____________ Start time: ____________ Finish time: ____________ Date: ____________ Movements: ____________ Start time: ____________ Finish time: ____________ Date: ____________ Movements: ____________ Start time: ____________ Finish time: ____________ Date: ____________ Movements: ____________ Start time: ____________ Finish time: ____________  Date: ____________ Movements: ____________ Start time: ____________ Finish time: ____________ Date: ____________ Movements: ____________ Start time: ____________ Finish time: ____________ Date: ____________ Movements: ____________ Start time: ____________ Finish time: ____________ Date: ____________ Movements: ____________ Start time: ____________ Finish time: ____________ Date: ____________ Movements: ____________ Start time: ____________ Finish time: ____________ Date: ____________ Movements: ____________ Start time: ____________ Finish time: ____________ Date: ____________ Movements: ____________ Start time: ____________ Finish time: ____________  Date: ____________ Movements: ____________ Start time: ____________ Finish time: ____________ Date: ____________ Movements: ____________ Start time: ____________ Finish time: ____________ Date: ____________ Movements: ____________ Start time: ____________ Finish time: ____________ Date: ____________ Movements:  ____________ Start time: ____________ Finish time: ____________ Date: ____________ Movements: ____________ Start time: ____________ Finish time: ____________ Date: ____________ Movements: ____________ Start time: ____________ Finish time: ____________ Date: ____________ Movements: ____________ Start time: ____________ Finish time: ____________  Date: ____________ Movements: ____________ Start time: ____________ Finish time: ____________ Date: ____________ Movements: ____________ Start time: ____________ Finish time: ____________ Date: ____________ Movements: ____________ Start time: ____________ Finish time: ____________ Date: ____________ Movements:   ____________ Start time: ____________ Finish time: ____________ Date: ____________ Movements: ____________ Start time: ____________ Finish time: ____________ Date: ____________ Movements: ____________ Start time: ____________ Finish time: ____________ Date: ____________ Movements: ____________ Start time: ____________ Finish time: ____________  Date: ____________ Movements: ____________ Start time: ____________ Finish time: ____________ Date: ____________ Movements: ____________ Start time: ____________ Finish time: ____________ Date: ____________ Movements: ____________ Start time: ____________ Finish time: ____________ Date: ____________ Movements: ____________ Start time: ____________ Finish time: ____________ Date: ____________ Movements: ____________ Start time: ____________ Finish time: ____________ Date: ____________ Movements: ____________ Start time: ____________ Finish time: ____________ Date: ____________ Movements: ____________ Start time: ____________ Finish time: ____________  Date: ____________ Movements: ____________ Start time: ____________ Finish time: ____________ Date: ____________ Movements: ____________ Start time: ____________ Finish time: ____________ Date: ____________ Movements: ____________ Start time: ____________ Finish  time: ____________ Date: ____________ Movements: ____________ Start time: ____________ Finish time: ____________ Date: ____________ Movements: ____________ Start time: ____________ Finish time: ____________ Date: ____________ Movements: ____________ Start time: ____________ Finish time: ____________ Date: ____________ Movements: ____________ Start time: ____________ Finish time: ____________  Date: ____________ Movements: ____________ Start time: ____________ Finish time: ____________ Date: ____________ Movements: ____________ Start time: ____________ Finish time: ____________ Date: ____________ Movements: ____________ Start time: ____________ Finish time: ____________ Date: ____________ Movements: ____________ Start time: ____________ Finish time: ____________ Date: ____________ Movements: ____________ Start time: ____________ Finish time: ____________ Date: ____________ Movements: ____________ Start time: ____________ Finish time: ____________ Document Released: 04/18/2006 Document Revised: 06/11/2011 Document Reviewed: 10/19/2008 [employer] Patient Information 2013 [employer].  

## 2012-04-01 NOTE — Progress Notes (Signed)
Pt would like to discuss options since baby is still in breech position.

## 2012-04-01 NOTE — Progress Notes (Signed)
[redacted]w[redacted]d [place] EFW 6-15 73% AFI 10.1 Breech presentation Fluid low normal Recheck AFI @[place]  Pt desires C/S with SR OR AVS

## 2012-04-04 LAB — US OB FOLLOW UP

## 2012-04-08 ENCOUNTER — Telehealth: Payer: Self-pay | Admitting: Obstetrics and Gynecology

## 2012-04-08 NOTE — Telephone Encounter (Signed)
Patient desires to do C/S after 40 weeks to give the baby more time to turn.  Discussion with Dr. Su Hilt regarding patients concerns and she stated that as long as the patient didn't have any high risk factors that indicated otherwise, she didn't see a problem with the patient delivering at 40 2/7 weeks.  I reviewed the patients problem list (which includes anxiety) and asked her about the subchorionic hemorrhage in her problem list and she flipped out telling me she didn't know anything about that.  I discussed it further with her and she said now one told her that she had a "cord hemorrhage"  I tried to clarify the issue and she said she was not made aware of condition.  I revisited the notes that indicated she was crying/emotional in that visit and may have missed that, but it's not generally something to worry about and often resolves itself.  I just wanted to know if she had any spotting recently as a result and she said she has never had any spotting.  I asked her to discuss everything with the Doctor.    Merita Norton Pridgen

## 2012-04-08 NOTE — Telephone Encounter (Signed)
Primary C/S rescheduled to 04/21/12 at 2:00pm with SR.  -[name]

## 2012-04-08 NOTE — Telephone Encounter (Signed)
Primary C/S rescheduled to 04/21/12 at 9:30 with SR.  -ap

## 2012-04-08 NOTE — Telephone Encounter (Signed)
Primary C/S Scheduled for 04/14/12 at 9:30 with SR.  -[name]

## 2012-04-10 ENCOUNTER — Encounter: Payer: Self-pay | Admitting: Obstetrics and Gynecology

## 2012-04-10 ENCOUNTER — Ambulatory Visit (INDEPENDENT_AMBULATORY_CARE_PROVIDER_SITE_OTHER): Payer: BC Managed Care – PPO

## 2012-04-10 ENCOUNTER — Encounter (HOSPITAL_COMMUNITY): Payer: Self-pay | Admitting: Pharmacist

## 2012-04-10 ENCOUNTER — Ambulatory Visit (INDEPENDENT_AMBULATORY_CARE_PROVIDER_SITE_OTHER): Payer: BC Managed Care – PPO | Admitting: Obstetrics and Gynecology

## 2012-04-10 ENCOUNTER — Encounter: Payer: BC Managed Care – PPO | Admitting: Obstetrics and Gynecology

## 2012-04-10 VITALS — BP 108/58 | Wt 190.0 lb

## 2012-04-10 DIAGNOSIS — Z331 Pregnant state, incidental: Secondary | ICD-10-CM

## 2012-04-10 LAB — US OB LIMITED

## 2012-04-10 NOTE — Progress Notes (Signed)
[redacted]w[redacted]d Pt thinks may have lost mucus plug throughout this week Pt noticed a small clot on tissue in bowl

## 2012-04-10 NOTE — Progress Notes (Signed)
[redacted]w[redacted]d GFM Still breech: c/s scheduled 04/21/12: procedure, R&B discussed

## 2012-04-12 ENCOUNTER — Encounter (HOSPITAL_COMMUNITY): Payer: Self-pay

## 2012-04-12 ENCOUNTER — Encounter (HOSPITAL_COMMUNITY): Payer: Self-pay | Admitting: *Deleted

## 2012-04-12 ENCOUNTER — Inpatient Hospital Stay (HOSPITAL_COMMUNITY): Payer: BC Managed Care – PPO

## 2012-04-12 ENCOUNTER — Inpatient Hospital Stay (HOSPITAL_COMMUNITY)
Admission: AD | Admit: 2012-04-12 | Discharge: 2012-04-14 | DRG: 370 | Disposition: A | Discharge status: Home / Self Care | Payer: BC Managed Care – PPO | Source: Ambulatory Visit | Attending: Obstetrics and Gynecology | Admitting: Obstetrics and Gynecology

## 2012-04-12 ENCOUNTER — Encounter (HOSPITAL_COMMUNITY)
Admission: AD | Disposition: A | Discharge status: Home / Self Care | Payer: Self-pay | Source: Ambulatory Visit | Attending: Obstetrics and Gynecology

## 2012-04-12 DIAGNOSIS — Z98891 History of uterine scar from previous surgery: Secondary | ICD-10-CM

## 2012-04-12 DIAGNOSIS — Z22330 Carrier of Group B streptococcus: Secondary | ICD-10-CM

## 2012-04-12 DIAGNOSIS — O99344 Other mental disorders complicating childbirth: Secondary | ICD-10-CM

## 2012-04-12 DIAGNOSIS — O468X9 Other antepartum hemorrhage, unspecified trimester: Secondary | ICD-10-CM

## 2012-04-12 DIAGNOSIS — D485 Neoplasm of uncertain behavior of skin: Secondary | ICD-10-CM

## 2012-04-12 DIAGNOSIS — O09299 Supervision of pregnancy with other poor reproductive or obstetric history, unspecified trimester: Secondary | ICD-10-CM

## 2012-04-12 DIAGNOSIS — O321XX0 Maternal care for breech presentation, not applicable or unspecified: Principal | ICD-10-CM | POA: Diagnosis present

## 2012-04-12 DIAGNOSIS — D239 Other benign neoplasm of skin, unspecified: Secondary | ICD-10-CM | POA: Diagnosis present

## 2012-04-12 DIAGNOSIS — O9903 Anemia complicating the puerperium: Secondary | ICD-10-CM | POA: Diagnosis not present

## 2012-04-12 DIAGNOSIS — O99892 Other specified diseases and conditions complicating childbirth: Secondary | ICD-10-CM | POA: Diagnosis present

## 2012-04-12 DIAGNOSIS — D649 Anemia, unspecified: Secondary | ICD-10-CM | POA: Diagnosis not present

## 2012-04-12 LAB — CBC
Hemoglobin: 12.9 g/dL (ref 12.0–15.0)
MCH: 34.4 pg — ABNORMAL HIGH (ref 26.0–34.0)
MCV: 98.1 fL (ref 78.0–100.0)
RBC: 3.75 MIL/uL — ABNORMAL LOW (ref 3.87–5.11)

## 2012-04-12 SURGERY — Surgical Case
Anesthesia: Spinal | Site: Abdomen | Wound class: Clean Contaminated

## 2012-04-12 MED ORDER — GLYCOPYRROLATE 0.2 MG/ML IJ SOLN
INTRAMUSCULAR | Status: DC | PRN
  Administered 2012-04-12: 0.2 mg via INTRAVENOUS

## 2012-04-12 MED ORDER — ZOLPIDEM TARTRATE 5 MG PO TABS: 5.0000 mg | ORAL_TABLET | Freq: Every evening | ORAL | Status: DC | PRN

## 2012-04-12 MED ORDER — NALOXONE HCL 0.4 MG/ML IJ SOLN: 0.4000 mg | INTRAMUSCULAR | Status: DC | PRN

## 2012-04-12 MED ORDER — IBUPROFEN 600 MG PO TABS: 600.0000 mg | ORAL_TABLET | Freq: Four times a day (QID) | ORAL | Status: DC | PRN

## 2012-04-12 MED ORDER — EPHEDRINE 5 MG/ML INJ
INTRAVENOUS | Status: AC
  Filled 2012-04-12: qty 10

## 2012-04-12 MED ORDER — EPHEDRINE SULFATE 50 MG/ML IJ SOLN
INTRAMUSCULAR | Status: DC | PRN
  Administered 2012-04-12 (×2): 10 mg via INTRAVENOUS
  Administered 2012-04-12: 20 mg via INTRAVENOUS
  Administered 2012-04-12: 10 mg via INTRAVENOUS

## 2012-04-12 MED ORDER — ONDANSETRON HCL 4 MG PO TABS: 4.0000 mg | ORAL_TABLET | ORAL | Status: DC | PRN

## 2012-04-12 MED ORDER — GLYCOPYRROLATE 0.2 MG/ML IJ SOLN
INTRAMUSCULAR | Status: AC
  Filled 2012-04-12: qty 1

## 2012-04-12 MED ORDER — OXYTOCIN 10 UNIT/ML IJ SOLN
INTRAMUSCULAR | Status: AC
  Filled 2012-04-12: qty 4

## 2012-04-12 MED ORDER — OXYTOCIN 40 UNITS IN LACTATED RINGERS INFUSION - SIMPLE MED: 62.5000 mL/h | INTRAVENOUS | Status: AC

## 2012-04-12 MED ORDER — LACTATED RINGERS IV SOLN: INTRAVENOUS | Status: DC

## 2012-04-12 MED ORDER — SCOPOLAMINE 1 MG/3DAYS TD PT72
MEDICATED_PATCH | TRANSDERMAL | Status: AC
  Filled 2012-04-12: qty 1

## 2012-04-12 MED ORDER — TETANUS-DIPHTH-ACELL PERTUSSIS 5-2.5-18.5 LF-MCG/0.5 IM SUSP
0.5000 mL | Freq: Once | INTRAMUSCULAR | Status: AC
  Administered 2012-04-13: 0.5 mL via INTRAMUSCULAR
  Filled 2012-04-12: qty 0.5

## 2012-04-12 MED ORDER — CEFAZOLIN SODIUM-DEXTROSE 2-3 GM-% IV SOLR
2.0000 g | INTRAVENOUS | Status: AC
  Administered 2012-04-12: 2 g via INTRAVENOUS
  Filled 2012-04-12: qty 50

## 2012-04-12 MED ORDER — MENTHOL 3 MG MT LOZG: 1.0000 | LOZENGE | OROMUCOSAL | Status: DC | PRN

## 2012-04-12 MED ORDER — KETOROLAC TROMETHAMINE 30 MG/ML IJ SOLN
INTRAMUSCULAR | Status: AC
  Administered 2012-04-12: 30 mg via INTRAVENOUS
  Filled 2012-04-12: qty 1

## 2012-04-12 MED ORDER — BUPIVACAINE HCL (PF) 0.25 % IJ SOLN
INTRAMUSCULAR | Status: AC
  Filled 2012-04-12: qty 30

## 2012-04-12 MED ORDER — MORPHINE SULFATE (PF) 0.5 MG/ML IJ SOLN
INTRAMUSCULAR | Status: DC | PRN
  Administered 2012-04-12: .15 mg via INTRATHECAL

## 2012-04-12 MED ORDER — SENNOSIDES-DOCUSATE SODIUM 8.6-50 MG PO TABS
2.0000 | ORAL_TABLET | Freq: Every day | ORAL | Status: DC
  Administered 2012-04-12 – 2012-04-13 (×2): 2 via ORAL

## 2012-04-12 MED ORDER — LACTATED RINGERS IV SOLN
INTRAVENOUS | Status: DC | PRN
  Administered 2012-04-12 (×3): via INTRAVENOUS

## 2012-04-12 MED ORDER — CITRIC ACID-SODIUM CITRATE 334-500 MG/5ML PO SOLN
30.0000 mL | Freq: Once | ORAL | Status: AC
  Administered 2012-04-12: 30 mL via ORAL
  Filled 2012-04-12: qty 15

## 2012-04-12 MED ORDER — ONDANSETRON HCL 4 MG/2ML IJ SOLN: 4.0000 mg | Freq: Three times a day (TID) | INTRAMUSCULAR | Status: DC | PRN

## 2012-04-12 MED ORDER — METOCLOPRAMIDE HCL 5 MG/ML IJ SOLN: 10.0000 mg | Freq: Three times a day (TID) | INTRAMUSCULAR | Status: DC | PRN

## 2012-04-12 MED ORDER — SCOPOLAMINE 1 MG/3DAYS TD PT72
1.0000 | MEDICATED_PATCH | Freq: Once | TRANSDERMAL | Status: DC
  Administered 2012-04-12: 1.5 mg via TRANSDERMAL

## 2012-04-12 MED ORDER — KETOROLAC TROMETHAMINE 30 MG/ML IJ SOLN: 30.0000 mg | Freq: Four times a day (QID) | INTRAMUSCULAR | Status: AC | PRN

## 2012-04-12 MED ORDER — DIPHENHYDRAMINE HCL 25 MG PO CAPS: 25.0000 mg | ORAL_CAPSULE | ORAL | Status: DC | PRN

## 2012-04-12 MED ORDER — PHENYLEPHRINE 40 MCG/ML (10ML) SYRINGE FOR IV PUSH (FOR BLOOD PRESSURE SUPPORT)
PREFILLED_SYRINGE | INTRAVENOUS | Status: AC
  Filled 2012-04-12: qty 10

## 2012-04-12 MED ORDER — DIPHENHYDRAMINE HCL 25 MG PO CAPS: 25.0000 mg | ORAL_CAPSULE | Freq: Four times a day (QID) | ORAL | Status: DC | PRN

## 2012-04-12 MED ORDER — SODIUM CHLORIDE 0.9 % IJ SOLN: 3.0000 mL | INTRAMUSCULAR | Status: DC | PRN

## 2012-04-12 MED ORDER — KETOROLAC TROMETHAMINE 30 MG/ML IJ SOLN
30.0000 mg | Freq: Four times a day (QID) | INTRAMUSCULAR | Status: AC | PRN
  Administered 2012-04-12: 30 mg via INTRAVENOUS

## 2012-04-12 MED ORDER — PHENYLEPHRINE 40 MCG/ML (10ML) SYRINGE FOR IV PUSH (FOR BLOOD PRESSURE SUPPORT)
PREFILLED_SYRINGE | INTRAVENOUS | Status: AC
  Filled 2012-04-12: qty 5

## 2012-04-12 MED ORDER — OXYCODONE-ACETAMINOPHEN 5-325 MG PO TABS
1.0000 | ORAL_TABLET | ORAL | Status: DC | PRN
  Administered 2012-04-14: 1 via ORAL
  Filled 2012-04-12: qty 1

## 2012-04-12 MED ORDER — FENTANYL CITRATE 0.05 MG/ML IJ SOLN
INTRAMUSCULAR | Status: AC
  Filled 2012-04-12: qty 2

## 2012-04-12 MED ORDER — DIPHENHYDRAMINE HCL 50 MG/ML IJ SOLN: 12.5000 mg | INTRAMUSCULAR | Status: DC | PRN

## 2012-04-12 MED ORDER — ONDANSETRON HCL 4 MG/2ML IJ SOLN
INTRAMUSCULAR | Status: DC | PRN
  Administered 2012-04-12: 4 mg via INTRAVENOUS

## 2012-04-12 MED ORDER — WITCH HAZEL-GLYCERIN EX PADS: 1.0000 "application " | MEDICATED_PAD | CUTANEOUS | Status: DC | PRN

## 2012-04-12 MED ORDER — HYDROMORPHONE HCL PF 1 MG/ML IJ SOLN: 0.2500 mg | INTRAMUSCULAR | Status: DC | PRN

## 2012-04-12 MED ORDER — FAMOTIDINE IN NACL 20-0.9 MG/50ML-% IV SOLN
20.0000 mg | Freq: Once | INTRAVENOUS | Status: AC
  Administered 2012-04-12: 20 mg via INTRAVENOUS
  Filled 2012-04-12: qty 50

## 2012-04-12 MED ORDER — BUPIVACAINE IN DEXTROSE 0.75-8.25 % IT SOLN
INTRATHECAL | Status: DC | PRN
  Administered 2012-04-12: 2 mL via INTRATHECAL

## 2012-04-12 MED ORDER — SODIUM CHLORIDE 0.9 % IR SOLN
Status: DC | PRN
  Administered 2012-04-12: 1000 mL

## 2012-04-12 MED ORDER — SIMETHICONE 80 MG PO CHEW: 80.0000 mg | CHEWABLE_TABLET | ORAL | Status: DC | PRN

## 2012-04-12 MED ORDER — LANOLIN HYDROUS EX OINT: 1.0000 "application " | TOPICAL_OINTMENT | CUTANEOUS | Status: DC | PRN

## 2012-04-12 MED ORDER — NALBUPHINE HCL 10 MG/ML IJ SOLN
5.0000 mg | INTRAMUSCULAR | Status: DC | PRN
  Filled 2012-04-12: qty 1

## 2012-04-12 MED ORDER — SIMETHICONE 80 MG PO CHEW
80.0000 mg | CHEWABLE_TABLET | Freq: Three times a day (TID) | ORAL | Status: DC
  Administered 2012-04-12 – 2012-04-14 (×8): 80 mg via ORAL

## 2012-04-12 MED ORDER — DIBUCAINE 1 % RE OINT: 1.0000 "application " | TOPICAL_OINTMENT | RECTAL | Status: DC | PRN

## 2012-04-12 MED ORDER — ONDANSETRON HCL 4 MG/2ML IJ SOLN
INTRAMUSCULAR | Status: AC
  Filled 2012-04-12: qty 2

## 2012-04-12 MED ORDER — LACTATED RINGERS IV SOLN
40.0000 [IU] | INTRAVENOUS | Status: DC | PRN
  Administered 2012-04-12: 40 [IU] via INTRAVENOUS

## 2012-04-12 MED ORDER — PRENATAL MULTIVITAMIN CH
1.0000 | ORAL_TABLET | Freq: Every day | ORAL | Status: DC
  Filled 2012-04-12: qty 1

## 2012-04-12 MED ORDER — LACTATED RINGERS IV SOLN
INTRAVENOUS | Status: DC
  Administered 2012-04-12: 19:00:00 via INTRAVENOUS

## 2012-04-12 MED ORDER — BUPIVACAINE HCL (PF) 0.25 % IJ SOLN
INTRAMUSCULAR | Status: DC | PRN
  Administered 2012-04-12: 20 mL

## 2012-04-12 MED ORDER — FENTANYL CITRATE 0.05 MG/ML IJ SOLN
INTRAMUSCULAR | Status: DC | PRN
  Administered 2012-04-12: 25 ug via INTRATHECAL
  Administered 2012-04-12 (×3): 25 ug via INTRAVENOUS

## 2012-04-12 MED ORDER — PHENYLEPHRINE HCL 10 MG/ML IJ SOLN
INTRAMUSCULAR | Status: DC | PRN
  Administered 2012-04-12: 80 ug via INTRAVENOUS
  Administered 2012-04-12: 40 ug via INTRAVENOUS
  Administered 2012-04-12 (×2): 80 ug via INTRAVENOUS
  Administered 2012-04-12 (×3): 40 ug via INTRAVENOUS
  Administered 2012-04-12: 80 ug via INTRAVENOUS

## 2012-04-12 MED ORDER — NALOXONE HCL 1 MG/ML IJ SOLN: 1.0000 ug/kg/h | INTRAVENOUS | Status: DC | PRN

## 2012-04-12 MED ORDER — IBUPROFEN 600 MG PO TABS
600.0000 mg | ORAL_TABLET | Freq: Four times a day (QID) | ORAL | Status: DC
  Administered 2012-04-12 – 2012-04-14 (×8): 600 mg via ORAL
  Filled 2012-04-12 (×8): qty 1

## 2012-04-12 MED ORDER — ONDANSETRON HCL 4 MG/2ML IJ SOLN: 4.0000 mg | INTRAMUSCULAR | Status: DC | PRN

## 2012-04-12 MED ORDER — MORPHINE SULFATE 0.5 MG/ML IJ SOLN
INTRAMUSCULAR | Status: AC
  Filled 2012-04-12: qty 10

## 2012-04-12 MED ORDER — DIPHENHYDRAMINE HCL 50 MG/ML IJ SOLN: 25.0000 mg | INTRAMUSCULAR | Status: DC | PRN

## 2012-04-12 SURGICAL SUPPLY — 46 items
BENZOIN TINCTURE PRP APPL 2/3 (GAUZE/BANDAGES/DRESSINGS) IMPLANT
BLADE EXTENDED COATED 6.5IN (ELECTRODE) IMPLANT
BLADE HEX COATED 2.75 (ELECTRODE) IMPLANT
BOOTIES KNEE HIGH SLOAN (MISCELLANEOUS) ×8 IMPLANT
CLOTH BEACON ORANGE TIMEOUT ST (SAFETY) ×2 IMPLANT
CONTAINER PREFILL 10% NBF 15ML (MISCELLANEOUS) IMPLANT
DERMABOND ADVANCED (GAUZE/BANDAGES/DRESSINGS)
DERMABOND ADVANCED .7 DNX12 (GAUZE/BANDAGES/DRESSINGS) IMPLANT
DRAIN JACKSON PRT FLT 7MM (DRAIN) IMPLANT
DRAPE LG THREE QUARTER DISP (DRAPES) ×2 IMPLANT
DRESSING TELFA 8X3 (GAUZE/BANDAGES/DRESSINGS) IMPLANT
DRSG COVADERM PLUS 2X2 (GAUZE/BANDAGES/DRESSINGS) ×2 IMPLANT
DRSG OPSITE POSTOP 4X10 (GAUZE/BANDAGES/DRESSINGS) ×2 IMPLANT
DURAPREP 26ML APPLICATOR (WOUND CARE) ×2 IMPLANT
ELECT REM PT RETURN 9FT ADLT (ELECTROSURGICAL) ×2
ELECTRODE REM PT RTRN 9FT ADLT (ELECTROSURGICAL) ×1 IMPLANT
EVACUATOR SILICONE 100CC (DRAIN) IMPLANT
EXTRACTOR VACUUM KIWI (MISCELLANEOUS) IMPLANT
EXTRACTOR VACUUM M CUP 4 TUBE (SUCTIONS) IMPLANT
GAUZE SPONGE 4X4 12PLY STRL LF (GAUZE/BANDAGES/DRESSINGS) IMPLANT
GLOVE SURG SS PI 6.5 STRL IVOR (GLOVE) ×8 IMPLANT
GOWN PREVENTION PLUS LG XLONG (DISPOSABLE) ×6 IMPLANT
KIT ABG SYR 3ML LUER SLIP (SYRINGE) IMPLANT
NEEDLE HYPO 25X5/8 SAFETYGLIDE (NEEDLE) ×2 IMPLANT
NEEDLE SPNL 22GX3.5 QUINCKE BK (NEEDLE) ×2 IMPLANT
NS IRRIG 1000ML POUR BTL (IV SOLUTION) ×2 IMPLANT
PACK C SECTION WH (CUSTOM PROCEDURE TRAY) ×2 IMPLANT
PAD ABD 7.5X8 STRL (GAUZE/BANDAGES/DRESSINGS) IMPLANT
PAD OB MATERNITY 4.3X12.25 (PERSONAL CARE ITEMS) ×2 IMPLANT
SLEEVE SCD COMPRESS KNEE MED (MISCELLANEOUS) ×2 IMPLANT
STRIP CLOSURE SKIN 1/4X4 (GAUZE/BANDAGES/DRESSINGS) IMPLANT
SUT CHROMIC 2 0 SH (SUTURE) IMPLANT
SUT MNCRL AB 3-0 PS2 27 (SUTURE) ×2 IMPLANT
SUT SILK 0 FSL (SUTURE) IMPLANT
SUT VIC AB 0 CT1 27 (SUTURE) ×2
SUT VIC AB 0 CT1 27XBRD ANBCTR (SUTURE) ×2 IMPLANT
SUT VIC AB 0 CT1 36 (SUTURE) ×4 IMPLANT
SUT VIC AB 0 CTXB 36 (SUTURE) IMPLANT
SUT VIC AB 2-0 CT1 27 (SUTURE) ×1
SUT VIC AB 2-0 CT1 TAPERPNT 27 (SUTURE) ×1 IMPLANT
SUT VIC AB 2-0 SH 27 (SUTURE)
SUT VIC AB 2-0 SH 27XBRD (SUTURE) IMPLANT
SYR CONTROL 10ML LL (SYRINGE) ×2 IMPLANT
TOWEL OR 17X24 6PK STRL BLUE (TOWEL DISPOSABLE) ×6 IMPLANT
TRAY FOLEY CATH 14FR (SET/KITS/TRAYS/PACK) ×2 IMPLANT
WATER STERILE IRR 1000ML POUR (IV SOLUTION) ×2 IMPLANT

## 2012-04-12 NOTE — Op Note (Signed)
Cesarean Section Procedure Note  Indications: malpresentation: Homero Fellers breech  Pre-operative Diagnosis: 39 week 0 day pregnancy.              Pigmented nevus      Post-operative Diagnosis: same  Surgeon: Hal Morales  First Assistant:   Elsie Ra CNM  Surgeon: Hal Morales   Assistants:   Anesthesia: Spinal anesthesia  ASA Class: 2  Procedure Details  The patient was seen in the Holding Room. The risks, benefits, complications, treatment options, and expected outcomes were discussed with the patient.  The patient concurred with the proposed plan, giving informed consent.  The site of surgery properly noted/marked. The patient was taken to Operating Room # 1, identified as Elaine Black and the procedure verified as C-Section Delivery. A Time Out was held and the above information confirmed.  After induction of anesthesia, the patient was  prepped with chlor prep in the usual sterile manner.A foley catheter was placed under sterile conditions.  The patient was then draped in the usual fashion.   Suprapubicsubcutaneous injection of 0.25% Bupivacaine   A Pfannenstiel incision was made and carried down through the subcutaneous tissue to the fascia. Fascial incision was made and extended transversely. The fascia was separated from the underlying rectus tissue superiorly and inferiorly. The peritoneum was identified and entered. Peritoneal incision was extended longitudinally.  The bladder blade was placed.   A low transverse uterine incision was made two cm above the uterovesical fold, and that incision extended transversely bluntly.The infant was  delivered from frank breech right sacrum transverse presentation was a living female infant with Apgar scores of 8 at one minute and 9 at five minutes. After the umbilical cord was clamped and cut cord blood was obtained for evaluation. The placenta was removed intact and appeared normal. The uterine outline, tubes and ovaries  appeared norma for the gravid state. The uterine incision was closed with running locked sutures of 0 Vicryl. An imbricating layer of sutures was placed. Hemostasis was observed. Lavage was carried out until clear. The peritoneum was closed with a running suture of 2-0 Vicryl.  The rectus muscles were reapproximated with a figure of 8 suture of 2-0 Vicryl.  The fascia was then reapproximated with a running sutures of 0 Vicryl .Renforcing figure of 8 sutures of 0Vicryl were placed on either side of midline.   The skin was reapproximated with 3-0 moncryl.  A sterile dressing was applied.  Instrument, sponge, and needle counts were correct prior to the abdominal closure and at the conclusion of the case.   After the sterile dressing was applied. The right inner thigh at the site of the pigmented nevus was prepped with Betadine and infiltrated with 10 cc of 0.25% bupivacaine.  The 12 mm nevus was excised and the base cauterized for hemostasis. The skin was closed with a subcuticular suture of 3-0 Monocryl. A sterile dressing was applied.  The patient was taken to the recovery room in satisfactory condition having tolerated the entire procedure well   Findings:  Placenta contained a 3vessel cord  Estimated Blood Loss:  500 cc         Drains: None         Total IV Fluids: 2000 ml         Specimens: Placenta to birthing suite   pigmented nevus to pathology        Implants: None         Complications: ::"None; patient tolerated the procedure well."  Disposition: PACU - hemodynamically stable.         Condition: stable  Attending Attestation: I performed the procedure.  HAYGOOD,VANESSA P  04/12/2012 10:21 AM

## 2012-04-12 NOTE — Addendum Note (Signed)
Addendum  created 04/12/12 1649 by Lincoln Brigham, CRNA   Modules edited:Notes Section

## 2012-04-12 NOTE — H&P (Signed)
Elaine Black is a 32 y.o. female presenting for labor eval. Contractions since last evening, now closer and lasting longer, some mucous and bloody show, denies LOF, +FM.  HPI: Pt began [facility] at 8wks, [facility] =04/19/12 by LMP 1st trimester screen WNL AFP WNL Anatomy scan at 19wks WNL 1hr gtt =90 Persistent breech presentation, pt declined ECV, scheduled for primary c/s on 1/20 VE in the office on 1/9 was closed   Maternal Medical History:  Reason for admission: Reason for admission: contractions.  Contractions: Onset was 3-5 hours ago.   Frequency: regular.   Duration is approximately 60 seconds.   Perceived severity is mild.    Fetal activity: Perceived fetal activity is normal.   Last perceived fetal movement was within the past hour.    Prenatal complications: no prenatal complications   OB History    Grav Para Term Preterm Abortions TAB SAB Ect Mult Living   2 0 0 0 1 0 1 0 0 0      Past Medical History  Diagnosis Date  . H/O candidiasis   . H/O varicella   . History of TB (tuberculosis)     Exposed at age 89; meds x60mo;   . Rectal bleeding   . Irregular heart rate 2010    "flutters" EKG and w/u WNL  . 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  . Anxiety in pregnancy, antepartum 09/10/2011    R/t previous MAB   Past Surgical History  Procedure Date  . Colonoscopy   . Wisdom tooth extraction 2001  . Left ear     removed benign cyst  . Dilation and evacuation 03/02/2011    Procedure: DILATATION AND EVACUATION (D&E);  Surgeon: Hal Morales, MD;  Location: [facility];  Service: Gynecology;  Laterality: N/A;  With intraoperative ultrasound  . Polypectomies     receives frequent colonoscopies for h/o rectal bleeding & polyps removed  . No past surgeries    Family History: family history includes Alcohol abuse in her maternal grandfather; Asthma in her father and mother; Cancer in her paternal  grandmother; Diabetes in her paternal grandmother; Diverticulitis in her father; Heart disease in her father, paternal grandfather, and paternal uncle; Hypertension in her father; Liver disease in her father; and Mental illness in her maternal grandmother. Social History:  reports that she has never smoked. She has never used smokeless tobacco. She reports that she does not drink alcohol or use illicit drugs.   Prenatal Transfer Tool  Maternal Diabetes: No Genetic Screening: Normal Maternal Ultrasounds/Referrals: Normal Fetal Ultrasounds or other Referrals:  None Maternal Substance Abuse:  No Significant Maternal Medications:  None Significant Maternal Lab Results:  Lab values include: Group B Strep positive Other Comments:  None  Review of Systems  Psychiatric/Behavioral: The patient is nervous/anxious.   All other systems reviewed and are negative.    Dilation: 2 Effacement (%): 50 Station: -2 Exam by:: S.Kushi Kun,CNM Blood pressure 120/78, pulse 95, temperature 99 F (37.2 C), resp. rate 20, height 6' (1.829 m), weight 188 lb (85.276 kg), last menstrual period 07/14/2011, SpO2 98.00%. Maternal Exam:  Uterine Assessment: Contraction strength is mild.  Contraction duration is 50 seconds. Contraction frequency is regular.   Abdomen: Patient reports no abdominal tenderness. Fundal height is aga.   Estimated fetal weight is 8#.   Fetal presentation: breech  Introitus: Normal vulva. Normal vagina.  Pelvis: adequate for delivery.   Cervix: Cervix evaluated by  digital exam.     Fetal Exam Fetal Monitor Review: Mode: ultrasound.   Baseline rate: 130.  Variability: moderate (6-25 bpm).   Pattern: accelerations present and no decelerations.    Fetal State Assessment: Category I - tracings are normal.     Physical Exam  Nursing note and vitals reviewed. Constitutional: She is oriented to person, place, and time. She appears well-developed and well-nourished.       Somewhat  anxious,   HENT:  Head: Normocephalic.  Eyes: Pupils are equal, round, and reactive to light.  Neck: Normal range of motion.  Cardiovascular: Normal rate, regular rhythm and normal heart sounds.   Respiratory: Effort normal and breath sounds normal.  GI: Soft. Bowel sounds are normal.  Genitourinary: Vagina normal.  Musculoskeletal: Normal range of motion.  Neurological: She is alert and oriented to person, place, and time. She has normal reflexes.  Skin: Skin is warm and dry.  Psychiatric: She has a normal mood and affect. Her behavior is normal.    Prenatal labs: ABO, Rh: A/POS/-- (06/18 1131) Antibody: NEG (06/18 1131) Rubella: 191.5 (06/18 1131) RPR: NON REAC (10/23 0947)  HBsAg: NEGATIVE (06/18 1131)  HIV: NON REACTIVE (06/18 1131)  GBS: POSITIVE (12/17 1033)  GC/CT neg 1st trim screen and AFP WNL hgb at NOB =12.3 1hr gtt =90, hgb 11.2, RPR NR  Assessment/Plan: IUP at 39wks Early labor GBS pos FHR reassuring Homero Fellers breech - confirmed w BSUS   Admit to pre-op per c/w Dr Pennie Rushing Primary c/s for breech  Routine pre-op orders Pt also desires removal of mole on right inner thigh    Elaine Black M 04/12/2012, 7:17 AM

## 2012-04-12 NOTE — Transfer of Care (Signed)
Immediate Anesthesia Transfer of Care Note  Patient: Elaine Black  Procedure(s) Performed: Procedure(s) (LRB) with comments: CESAREAN SECTION (N/A) - Primary cesarean section with delivery of baby  girl at 84.  Apgars 8/9. Removal of nemis from inner thigh  Patient Location: PACU  Anesthesia Type:Spinal  Level of Consciousness: awake, alert  and oriented  Airway & Oxygen Therapy: Patient Spontanous Breathing  Post-op Assessment: Report given to PACU RN and Post -op Vital signs reviewed and stable  Post vital signs: Reviewed and stable  Complications: No apparent anesthesia complications

## 2012-04-12 NOTE — Anesthesia Postprocedure Evaluation (Signed)
  Anesthesia Post-op Note  Patient: Elaine Black  Procedure(s) Performed: Procedure(s) (LRB) with comments: CESAREAN SECTION (N/A) - Primary cesarean section with delivery of baby  girl at 64.  Apgars 8/9. Removal of nemis from inner thigh  Patient Location: mother baby  Anesthesia Type:Spinal  Level of Consciousness: awake, alert  and oriented  Airway and Oxygen Therapy: Patient Spontanous Breathing  Post-op Pain: mild  Post-op Assessment: Patient's Cardiovascular Status Stable, Respiratory Function Stable, Patent Airway, No signs of Nausea or vomiting and Pain level controlled  Post-op Vital Signs: stable  Complications: No apparent anesthesia complications

## 2012-04-12 NOTE — Anesthesia Preprocedure Evaluation (Addendum)
Anesthesia Evaluation  Patient identified by MRN, date of birth, ID band Patient awake    Reviewed: Allergy & Precautions, H&P , NPO status , Patient's Chart, lab work & pertinent test results  Airway Mallampati: II TM Distance: >3 FB Neck ROM: Full    Dental No notable dental hx. (+) Teeth Intact   Pulmonary  Hx/o Pulmonary TB breath sounds clear to auscultation  Pulmonary exam normal       Cardiovascular negative cardio ROS  Rhythm:regular Rate:Normal     Neuro/Psych PSYCHIATRIC DISORDERS Anxiety negative neurological ROS     GI/Hepatic negative GI ROS, Neg liver ROS,   Endo/Other  negative endocrine ROS  Renal/GU negative Renal ROS  negative genitourinary   Musculoskeletal negative musculoskeletal ROS (+)   Abdominal   Peds  Hematology negative hematology ROS (+)   Anesthesia Other Findings   Reproductive/Obstetrics (+) Pregnancy Breech 36 weeks in labor                          Anesthesia Physical Anesthesia Plan  ASA: II and emergent  Anesthesia Plan: Spinal   Post-op Pain Management:    Induction:   Airway Management Planned: Natural Airway  Additional Equipment:   Intra-op Plan:   Post-operative Plan:   Informed Consent: I have reviewed the patients History and Physical, chart, labs and discussed the procedure including the risks, benefits and alternatives for the proposed anesthesia with the patient or authorized representative who has indicated his/her understanding and acceptance.   Dental advisory given  Plan Discussed with: CRNA, Anesthesiologist and Surgeon  Anesthesia Plan Comments:         Anesthesia Quick Evaluation

## 2012-04-12 NOTE — Anesthesia Postprocedure Evaluation (Signed)
  Anesthesia Post-op Note  Patient: Elaine Black  Procedure(s) Performed: Procedure(s) (LRB) with comments: CESAREAN SECTION (N/A) - Primary cesarean section with delivery of baby  girl at 11.  Apgars 8/9. Removal of nemis from inner thigh  Patient Location: PACU  Anesthesia Type:Spinal  Level of Consciousness: awake, alert  and oriented  Airway and Oxygen Therapy: Patient Spontanous Breathing  Post-op Pain: none  Post-op Assessment: Post-op Vital signs reviewed, Patient's Cardiovascular Status Stable, Respiratory Function Stable, Patent Airway, No signs of Nausea or vomiting, Pain level controlled, No headache, No backache and No residual numbness  Post-op Vital Signs: Reviewed and stable  Complications: No apparent anesthesia complications

## 2012-04-12 NOTE — MAU Note (Signed)
Pt states she has been contracting and is having bloody snow-no active bleeding present

## 2012-04-12 NOTE — Anesthesia Procedure Notes (Signed)
Spinal  Patient location during procedure: OR Start time: 04/12/2012 8:18 AM Staffing Anesthesiologist: Angie Hogg A. Preanesthetic Checklist Completed: patient identified, site marked, surgical consent, pre-op evaluation, timeout performed, IV checked, risks and benefits discussed and monitors and equipment checked Spinal Block Patient position: sitting Prep: site prepped and draped and DuraPrep Patient monitoring: heart rate, cardiac monitor, continuous pulse ox and blood pressure Approach: midline Location: L3-4 Injection technique: single-shot Needle Needle type: Sprotte  Needle gauge: 24 G Needle length: 9 cm Needle insertion depth: 4 cm Assessment Sensory level: T6 Additional Notes Patient tolerated procedure well. Adequate sensory block.

## 2012-04-12 NOTE — OR Nursing (Addendum)
Uterus massaged by S. Modine Oppenheimer Charity fundraiser .  Two  Tubes of cord blood sent to lab. 100cc of blood evacuated from uterus during uterine massage.

## 2012-04-13 LAB — CBC
HCT: 32.2 % — ABNORMAL LOW (ref 36.0–46.0)
Hemoglobin: 10.9 g/dL — ABNORMAL LOW (ref 12.0–15.0)
MCH: 33.6 pg (ref 26.0–34.0)
MCV: 99.4 fL (ref 78.0–100.0)
Platelets: 145 10*3/uL — ABNORMAL LOW (ref 150–400)
RBC: 3.24 MIL/uL — ABNORMAL LOW (ref 3.87–5.11)

## 2012-04-13 NOTE — Progress Notes (Signed)
Patient ID: Elaine Black, female   DOB: April 19, 1980, 32 y.o.   MRN: [id number] Subjective: Postpartum Day 1: Cesarean Delivery Patient reports tolerating PO and no problems voiding.    Objective: Vital signs in last 24 hours: Temp:  [97.7 F (36.5 C)-99.5 F (37.5 C)] 98.4 F (36.9 C) (01/12 0515) Pulse Rate:  [78-125] 78  (01/12 0515) Resp:  [14-21] 20  (01/12 0515) BP: (109-132)/(55-93) 109/57 mmHg (01/12 0515) SpO2:  [96 %-100 %] 98 % (01/12 0515)  Physical Exam:  General: alert, cooperative, appears stated age and no distress Lochia: appropriate Uterine Fundus: firm Incision: healing well DVT Evaluation: No evidence of DVT seen on physical exam.   Basename 04/13/12 0545 04/12/12 0650  HGB 10.9* 12.9  HCT 32.2* 36.8    Assessment/Plan: Status post Cesarean section. Postoperative course complicated by Anemia  Begin iron therapy, which will be continued at discharge.  Luqman Perrelli P 04/13/2012, 8:15 AM

## 2012-04-13 NOTE — Progress Notes (Signed)
CSW attempted to meet with MOB to discuss hx of Anxiety, but spoke to RN first who states MOB has not had any rest and requested that CSW come back at a later time.  CSW agreed and will attempt to meet with MOB again tomorrow. 

## 2012-04-14 ENCOUNTER — Encounter (HOSPITAL_COMMUNITY): Payer: Self-pay | Admitting: Obstetrics and Gynecology

## 2012-04-14 DIAGNOSIS — Z98891 History of uterine scar from previous surgery: Secondary | ICD-10-CM

## 2012-04-14 LAB — RPR: RPR Ser Ql: NONREACTIVE

## 2012-04-14 MED ORDER — OXYCODONE-ACETAMINOPHEN 5-325 MG PO TABS: 1.0000 | ORAL_TABLET | ORAL | Status: DC | PRN

## 2012-04-14 MED ORDER — IBUPROFEN 600 MG PO TABS: 600.0000 mg | ORAL_TABLET | Freq: Four times a day (QID) | ORAL | Status: DC | PRN

## 2012-04-14 MED ORDER — NORETHINDRONE 0.35 MG PO TABS: 1.0000 | ORAL_TABLET | Freq: Every day | ORAL | Status: DC

## 2012-04-14 NOTE — Progress Notes (Signed)
Patient was referred for history of depression/anxiety. * Referral screened out by Clinical Social Worker because none of the following criteria appear to apply:  ~ History of anxiety/depression during this pregnancy, or of post-partum depression.  ~ Diagnosis of anxiety and/or depression within last 3 years  ~ History of depression due to pregnancy loss/loss of child  OR * Patient's symptoms currently being treated with medication and/or therapy.  Please contact the Clinical Social Worker if needs arise, or by the patient's request. Pt appears to be appropriate and not need of CSW intervention at this time. Pt doing well, as per RN.  

## 2012-04-14 NOTE — Discharge Summary (Signed)
Physician Discharge Summary  Patient ID: Elaine Black MRN: [id number] DOB/AGE: [birth year] 32 y.o.  Admit date: 04/12/2012 Discharge date: 04/14/2012  Admission Diagnoses: [redacted]w[redacted]d breech   Discharge Diagnoses:  Active Problems:  S/P cesarean section   Discharged Condition: stable  Hospital Course: [redacted]w[redacted]d breech, primary cesarean section, normal involution, lactating, micronor RX discussed.   Consults: None  Treatments: IV hydration  Discharge Exam: Blood pressure 106/69, pulse 71, temperature 98.3 F (36.8 C), temperature source Oral, resp. rate 20, height 6' (1.829 m), weight 188 lb (85.276 kg), last menstrual period 07/14/2011, SpO2 96.00%, unknown if currently breastfeeding. Subjective: Postpartum Day 2 Cesarean Delivery Patient reports tolerating PO, + flatus, + BM and no problems voiding, motrin and percocet working well for pain relief.   S: comfortable, little bleeding, slept     Feeding Hemoglobin & Hematocrit     Component Value Date/Time   HGB 10.9* 04/13/2012 0545   HCT 32.2* 04/13/2012 0545    Temp:  [97.9 F (36.6 C)-98.6 F (37 C)] 98.3 F (36.8 C) (01/13 0534) Pulse Rate:  [71-80] 71  (01/13 0534) Resp:  [20] 20  (01/13 0534) BP: (93-118)/(59-72) 106/69 mmHg (01/13 0534)  Physical Exam:  General: alert, cooperative and no distress Lochia: appropriate Uterine Fundus: firm Incision: dry no redness, well approximated DVT Evaluation: Negative Homan's sign. No significant calf/ankle edema. Lungs clear bilaterally AP RRR Bowel sounds active abd      nontympanic      Disposition: 01-Home or Self Care  Discharge Orders    Future Orders Please Complete By Expires   Discharge patient          Medication List     As of 04/14/2012  9:58 AM    TAKE these medications         ibuprofen 600 MG tablet   Commonly known as: ADVIL,MOTRIN   Take 1 tablet (600 mg total) by mouth every 6 (six) hours as needed for pain.      ASK your doctor about  these medications         prenatal multivitamin Tabs   Take 1 tablet by mouth every evening.         Micronor, RX percocet by Dr. Estanislado Pandy.     Follow-up Information    Follow up with [facility]. In 6 days.   Contact information:   [address] [place] [address] [phone]       reviewed s/s pp to report.  SignedLavera Guise 04/14/2012, 9:58 AM

## 2012-04-17 ENCOUNTER — Encounter: Payer: BC Managed Care – PPO | Admitting: Obstetrics and Gynecology

## 2012-04-17 ENCOUNTER — Other Ambulatory Visit: Payer: BC Managed Care – PPO

## 2012-04-17 ENCOUNTER — Other Ambulatory Visit (HOSPITAL_COMMUNITY): Payer: BC Managed Care – PPO

## 2012-04-21 ENCOUNTER — Inpatient Hospital Stay (HOSPITAL_COMMUNITY)
Admission: AD | Admit: 2012-04-21 | Payer: BC Managed Care – PPO | Source: Ambulatory Visit | Admitting: Obstetrics and Gynecology

## 2012-04-21 SURGERY — Surgical Case
Anesthesia: Regional

## 2012-05-19 ENCOUNTER — Ambulatory Visit: Payer: BC Managed Care – PPO | Admitting: Obstetrics and Gynecology

## 2012-05-19 ENCOUNTER — Encounter: Payer: Self-pay | Admitting: Obstetrics and Gynecology

## 2012-05-19 VITALS — BP 110/60 | Temp 98.1°F | Wt 164.0 lb

## 2012-05-19 DIAGNOSIS — Z98891 History of uterine scar from previous surgery: Secondary | ICD-10-CM

## 2012-05-19 DIAGNOSIS — F419 Anxiety disorder, unspecified: Secondary | ICD-10-CM

## 2012-05-19 NOTE — Progress Notes (Signed)
Subjective:  POST PARTUM VISIT   Date of delivery: 04/12/12 Female Name: Elaine Black  Vaginal delivery:no Cesarean section:yes Tubal ligation:no GDM:no Breast Feeding:yes Bottle Feeding:no Post-Partum Blues: only at 3 days pp Abnormal pap:no Normal GU function: yes Normal GI function:yes Returning to work:yes EPDS: 3  Elaine Black is a 32 y.o. female who presents for a postpartum visit, S/P C/S FOR BREECH PRESENTATION.  I have fully reviewed the prenatal and intrapartum course.    Patient is not sexually active.   The following portions of the patient's history were reviewed and updated as appropriate: allergies, current medications, past family history, past medical history, past social history, past surgical history and problem list.  Review of Systems Pertinent items are noted in HPI.   Objective:    BP 110/60  Wt 164 lb (74.39 kg)  BMI 22.24 kg/m2  General:  alert, cooperative and no distress     Lungs: clear to auscultation bilaterally  Heart:  regular rate and rhythm, S1, S2 normal, no murmur  Abdomen: soft, non-tender; bowel sounds normal; no masses,  no organomegaly.  Incision well healed   Vulva:  normal  Vagina: normal vagina  Cervix:  normal  Corpus: normal size, contour, position, consistency, mobility, non-tender  Adnexa:  normal adnexa             Assessment:     Normal postpartum exam.  Pap smear not done at today's visit.   Plan:   Dierdre Forth MD 05/19/2012 9:06 AM

## 2013-04-02 HISTORY — PX: SOFT TISSUE CYST EXCISION: SHX2418

## 2014-02-01 ENCOUNTER — Encounter: Payer: Self-pay | Admitting: Obstetrics and Gynecology

## 2015-01-28 LAB — OB RESULTS CONSOLE HIV ANTIBODY (ROUTINE TESTING): HIV: NONREACTIVE

## 2015-01-28 LAB — OB RESULTS CONSOLE RPR: RPR: NONREACTIVE

## 2015-01-28 LAB — OB RESULTS CONSOLE GC/CHLAMYDIA
Chlamydia: NEGATIVE
Gonorrhea: NEGATIVE

## 2015-09-06 LAB — OB RESULTS CONSOLE GBS: GBS: POSITIVE

## 2015-10-09 ENCOUNTER — Encounter (HOSPITAL_COMMUNITY)
Admission: AD | Disposition: A | Discharge status: Home / Self Care | Payer: Self-pay | Source: Ambulatory Visit | Attending: Obstetrics and Gynecology

## 2015-10-09 ENCOUNTER — Encounter (HOSPITAL_COMMUNITY): Payer: Self-pay | Admitting: *Deleted

## 2015-10-09 ENCOUNTER — Inpatient Hospital Stay (HOSPITAL_COMMUNITY): Payer: BLUE CROSS/BLUE SHIELD | Admitting: Anesthesiology

## 2015-10-09 ENCOUNTER — Inpatient Hospital Stay (HOSPITAL_COMMUNITY)
Admission: AD | Admit: 2015-10-09 | Discharge: 2015-10-11 | DRG: 766 | Disposition: A | Discharge status: Home / Self Care | Payer: BLUE CROSS/BLUE SHIELD | Source: Ambulatory Visit | Attending: Obstetrics and Gynecology | Admitting: Obstetrics and Gynecology

## 2015-10-09 DIAGNOSIS — Z98891 History of uterine scar from previous surgery: Secondary | ICD-10-CM

## 2015-10-09 DIAGNOSIS — Z833 Family history of diabetes mellitus: Secondary | ICD-10-CM

## 2015-10-09 DIAGNOSIS — Z3A40 40 weeks gestation of pregnancy: Secondary | ICD-10-CM | POA: Diagnosis not present

## 2015-10-09 DIAGNOSIS — Z825 Family history of asthma and other chronic lower respiratory diseases: Secondary | ICD-10-CM | POA: Diagnosis not present

## 2015-10-09 DIAGNOSIS — Z8249 Family history of ischemic heart disease and other diseases of the circulatory system: Secondary | ICD-10-CM | POA: Diagnosis not present

## 2015-10-09 DIAGNOSIS — Z8611 Personal history of tuberculosis: Secondary | ICD-10-CM | POA: Diagnosis not present

## 2015-10-09 DIAGNOSIS — Z8279 Family history of other congenital malformations, deformations and chromosomal abnormalities: Secondary | ICD-10-CM

## 2015-10-09 DIAGNOSIS — O34211 Maternal care for low transverse scar from previous cesarean delivery: Secondary | ICD-10-CM | POA: Diagnosis present

## 2015-10-09 DIAGNOSIS — O99824 Streptococcus B carrier state complicating childbirth: Secondary | ICD-10-CM | POA: Diagnosis present

## 2015-10-09 DIAGNOSIS — O321XX0 Maternal care for breech presentation, not applicable or unspecified: Secondary | ICD-10-CM | POA: Diagnosis present

## 2015-10-09 DIAGNOSIS — IMO0002 Reserved for concepts with insufficient information to code with codable children: Secondary | ICD-10-CM

## 2015-10-09 LAB — CBC
HEMATOCRIT: 35.7 % — AB (ref 36.0–46.0)
Hemoglobin: 12.2 g/dL (ref 12.0–15.0)
MCH: 32.9 pg (ref 26.0–34.0)
MCHC: 34.2 g/dL (ref 30.0–36.0)
MCV: 96.2 fL (ref 78.0–100.0)
Platelets: 152 10*3/uL (ref 150–400)
RBC: 3.71 MIL/uL — AB (ref 3.87–5.11)
RDW: 13.2 % (ref 11.5–15.5)
WBC: 13.3 10*3/uL — AB (ref 4.0–10.5)

## 2015-10-09 LAB — ABO/RH: ABO/RH(D): A POS

## 2015-10-09 LAB — TYPE AND SCREEN
ABO/RH(D): A POS
ANTIBODY SCREEN: NEGATIVE

## 2015-10-09 SURGERY — Surgical Case
Anesthesia: Epidural

## 2015-10-09 MED ORDER — ONDANSETRON HCL 4 MG/2ML IJ SOLN
INTRAMUSCULAR | Status: DC | PRN
  Administered 2015-10-09: 4 mg via INTRAVENOUS

## 2015-10-09 MED ORDER — MENTHOL 3 MG MT LOZG: 1.0000 | LOZENGE | OROMUCOSAL | Status: DC | PRN

## 2015-10-09 MED ORDER — FENTANYL CITRATE (PF) 100 MCG/2ML IJ SOLN
INTRAMUSCULAR | Status: AC
  Filled 2015-10-09: qty 2

## 2015-10-09 MED ORDER — IBUPROFEN 600 MG PO TABS
600.0000 mg | ORAL_TABLET | Freq: Four times a day (QID) | ORAL | Status: DC
  Administered 2015-10-10 – 2015-10-11 (×6): 600 mg via ORAL
  Filled 2015-10-09 (×5): qty 1

## 2015-10-09 MED ORDER — PHENYLEPHRINE 40 MCG/ML (10ML) SYRINGE FOR IV PUSH (FOR BLOOD PRESSURE SUPPORT)
PREFILLED_SYRINGE | INTRAVENOUS | Status: AC
  Filled 2015-10-09: qty 20

## 2015-10-09 MED ORDER — KETOROLAC TROMETHAMINE 30 MG/ML IJ SOLN
INTRAMUSCULAR | Status: AC
  Filled 2015-10-09: qty 1

## 2015-10-09 MED ORDER — LACTATED RINGERS IV SOLN
INTRAVENOUS | Status: DC
  Administered 2015-10-09: 07:00:00 via INTRAVENOUS

## 2015-10-09 MED ORDER — SENNOSIDES-DOCUSATE SODIUM 8.6-50 MG PO TABS
2.0000 | ORAL_TABLET | ORAL | Status: DC
  Administered 2015-10-11: 2 via ORAL
  Filled 2015-10-09 (×2): qty 2

## 2015-10-09 MED ORDER — OXYCODONE-ACETAMINOPHEN 5-325 MG PO TABS: 1.0000 | ORAL_TABLET | ORAL | Status: DC | PRN

## 2015-10-09 MED ORDER — PRENATAL MULTIVITAMIN CH: 1.0000 | ORAL_TABLET | Freq: Every day | ORAL | Status: DC

## 2015-10-09 MED ORDER — LIDOCAINE HCL (PF) 1 % IJ SOLN: 30.0000 mL | INTRAMUSCULAR | Status: DC | PRN

## 2015-10-09 MED ORDER — EPHEDRINE 5 MG/ML INJ
INTRAVENOUS | Status: AC
  Filled 2015-10-09: qty 10

## 2015-10-09 MED ORDER — DEXAMETHASONE SODIUM PHOSPHATE 4 MG/ML IJ SOLN
INTRAMUSCULAR | Status: AC
  Filled 2015-10-09: qty 1

## 2015-10-09 MED ORDER — DEXTROSE 5 % IV SOLN
5.0000 10*6.[IU] | Freq: Once | INTRAVENOUS | Status: AC
  Administered 2015-10-09: 5 10*6.[IU] via INTRAVENOUS
  Filled 2015-10-09: qty 5

## 2015-10-09 MED ORDER — DIPHENHYDRAMINE HCL 50 MG/ML IJ SOLN: 12.5000 mg | INTRAMUSCULAR | Status: DC | PRN

## 2015-10-09 MED ORDER — ONDANSETRON HCL 4 MG/2ML IJ SOLN: 4.0000 mg | Freq: Four times a day (QID) | INTRAMUSCULAR | Status: DC | PRN

## 2015-10-09 MED ORDER — MEASLES, MUMPS & RUBELLA VAC ~~LOC~~ INJ: 0.5000 mL | INJECTION | Freq: Once | SUBCUTANEOUS | Status: DC

## 2015-10-09 MED ORDER — DIPHENHYDRAMINE HCL 25 MG PO CAPS: 25.0000 mg | ORAL_CAPSULE | Freq: Four times a day (QID) | ORAL | Status: DC | PRN

## 2015-10-09 MED ORDER — FENTANYL CITRATE (PF) 100 MCG/2ML IJ SOLN
25.0000 ug | INTRAMUSCULAR | Status: DC | PRN
  Administered 2015-10-09: 25 ug via INTRAVENOUS
  Administered 2015-10-09: 50 ug via INTRAVENOUS

## 2015-10-09 MED ORDER — WITCH HAZEL-GLYCERIN EX PADS: 1.0000 "application " | MEDICATED_PAD | CUTANEOUS | Status: DC | PRN

## 2015-10-09 MED ORDER — MORPHINE SULFATE (PF) 0.5 MG/ML IJ SOLN
INTRAMUSCULAR | Status: AC
  Filled 2015-10-09: qty 10

## 2015-10-09 MED ORDER — LACTATED RINGERS IV SOLN
500.0000 mL | INTRAVENOUS | Status: DC | PRN
  Administered 2015-10-09: 500 mL via INTRAVENOUS

## 2015-10-09 MED ORDER — IBUPROFEN 600 MG PO TABS: 600.0000 mg | ORAL_TABLET | Freq: Four times a day (QID) | ORAL | Status: DC | PRN

## 2015-10-09 MED ORDER — BUPIVACAINE HCL (PF) 0.25 % IJ SOLN
INTRAMUSCULAR | Status: DC | PRN
  Administered 2015-10-09: 20 mL

## 2015-10-09 MED ORDER — OXYTOCIN 10 UNIT/ML IJ SOLN
40.0000 [IU] | INTRAVENOUS | Status: DC | PRN
  Administered 2015-10-09: 40 [IU] via INTRAVENOUS

## 2015-10-09 MED ORDER — SOD CITRATE-CITRIC ACID 500-334 MG/5ML PO SOLN
30.0000 mL | ORAL | Status: DC | PRN
  Administered 2015-10-09: 30 mL via ORAL
  Filled 2015-10-09: qty 15

## 2015-10-09 MED ORDER — DIPHENHYDRAMINE HCL 25 MG PO CAPS: 25.0000 mg | ORAL_CAPSULE | ORAL | Status: DC | PRN

## 2015-10-09 MED ORDER — FERROUS SULFATE 325 (65 FE) MG PO TABS
325.0000 mg | ORAL_TABLET | Freq: Two times a day (BID) | ORAL | Status: DC
  Administered 2015-10-10 (×2): 325 mg via ORAL
  Filled 2015-10-09 (×3): qty 1

## 2015-10-09 MED ORDER — NALOXONE HCL 0.4 MG/ML IJ SOLN
0.4000 mg | INTRAMUSCULAR | Status: DC | PRN
Start: 2015-10-09 — End: 2015-10-11

## 2015-10-09 MED ORDER — PHENYLEPHRINE 40 MCG/ML (10ML) SYRINGE FOR IV PUSH (FOR BLOOD PRESSURE SUPPORT)
80.0000 ug | PREFILLED_SYRINGE | INTRAVENOUS | Status: DC | PRN
  Filled 2015-10-09: qty 10

## 2015-10-09 MED ORDER — ACETAMINOPHEN 325 MG PO TABS: 650.0000 mg | ORAL_TABLET | ORAL | Status: DC | PRN

## 2015-10-09 MED ORDER — FENTANYL 2.5 MCG/ML BUPIVACAINE 1/10 % EPIDURAL INFUSION (WH - ANES)
14.0000 mL/h | INTRAMUSCULAR | Status: DC | PRN
  Administered 2015-10-09: 16 mL/h via EPIDURAL
  Filled 2015-10-09: qty 125

## 2015-10-09 MED ORDER — BUPIVACAINE HCL (PF) 0.25 % IJ SOLN
INTRAMUSCULAR | Status: AC
  Filled 2015-10-09: qty 10

## 2015-10-09 MED ORDER — OXYTOCIN 40 UNITS IN LACTATED RINGERS INFUSION - SIMPLE MED
2.5000 [IU]/h | INTRAVENOUS | Status: DC
Start: 2015-10-09 — End: 2015-10-09

## 2015-10-09 MED ORDER — EPHEDRINE 5 MG/ML INJ: 10.0000 mg | INTRAVENOUS | Status: DC | PRN

## 2015-10-09 MED ORDER — LACTATED RINGERS IV SOLN: INTRAVENOUS | Status: DC

## 2015-10-09 MED ORDER — ACETAMINOPHEN 500 MG PO TABS
1000.0000 mg | ORAL_TABLET | Freq: Four times a day (QID) | ORAL | Status: AC
  Administered 2015-10-10: 1000 mg via ORAL
  Filled 2015-10-09 (×3): qty 2

## 2015-10-09 MED ORDER — OXYCODONE-ACETAMINOPHEN 5-325 MG PO TABS: 2.0000 | ORAL_TABLET | ORAL | Status: DC | PRN

## 2015-10-09 MED ORDER — LACTATED RINGERS IV SOLN
INTRAVENOUS | Status: DC | PRN
  Administered 2015-10-09: 13:00:00 via INTRAVENOUS

## 2015-10-09 MED ORDER — NALOXONE HCL 2 MG/2ML IJ SOSY
1.0000 ug/kg/h | PREFILLED_SYRINGE | INTRAMUSCULAR | Status: DC | PRN
Start: 2015-10-09 — End: 2015-10-11

## 2015-10-09 MED ORDER — SCOPOLAMINE 1 MG/3DAYS TD PT72
MEDICATED_PATCH | TRANSDERMAL | Status: DC | PRN
  Administered 2015-10-09: 1 via TRANSDERMAL

## 2015-10-09 MED ORDER — KETOROLAC TROMETHAMINE 30 MG/ML IJ SOLN
30.0000 mg | Freq: Four times a day (QID) | INTRAMUSCULAR | Status: AC | PRN
  Administered 2015-10-09: 30 mg via INTRAMUSCULAR

## 2015-10-09 MED ORDER — SODIUM CHLORIDE 0.9% FLUSH: 3.0000 mL | INTRAVENOUS | Status: DC | PRN

## 2015-10-09 MED ORDER — NALBUPHINE HCL 10 MG/ML IJ SOLN: 5.0000 mg | Freq: Once | INTRAMUSCULAR | Status: DC | PRN

## 2015-10-09 MED ORDER — TERBUTALINE SULFATE 1 MG/ML IJ SOLN
0.2500 mg | Freq: Once | INTRAMUSCULAR | Status: AC
  Administered 2015-10-09: 0.25 mg via SUBCUTANEOUS

## 2015-10-09 MED ORDER — TETANUS-DIPHTH-ACELL PERTUSSIS 5-2.5-18.5 LF-MCG/0.5 IM SUSP: 0.5000 mL | Freq: Once | INTRAMUSCULAR | Status: DC

## 2015-10-09 MED ORDER — COCONUT OIL OIL: 1.0000 "application " | TOPICAL_OIL | Status: DC | PRN

## 2015-10-09 MED ORDER — EPHEDRINE 5 MG/ML INJ
10.0000 mg | INTRAVENOUS | Status: AC | PRN
  Administered 2015-10-09 (×2): 10 mg via INTRAVENOUS

## 2015-10-09 MED ORDER — SIMETHICONE 80 MG PO CHEW
80.0000 mg | CHEWABLE_TABLET | ORAL | Status: DC | PRN
  Filled 2015-10-09: qty 1

## 2015-10-09 MED ORDER — NALBUPHINE HCL 10 MG/ML IJ SOLN: 5.0000 mg | INTRAMUSCULAR | Status: DC | PRN

## 2015-10-09 MED ORDER — OXYTOCIN 10 UNIT/ML IJ SOLN
INTRAMUSCULAR | Status: AC
  Filled 2015-10-09: qty 4

## 2015-10-09 MED ORDER — LACTATED RINGERS IV SOLN: 500.0000 mL | Freq: Once | INTRAVENOUS | Status: DC

## 2015-10-09 MED ORDER — LIDOCAINE HCL (PF) 1 % IJ SOLN
INTRAMUSCULAR | Status: DC | PRN
  Administered 2015-10-09: 5 mL
  Administered 2015-10-09: 5 mL via EPIDURAL

## 2015-10-09 MED ORDER — SODIUM BICARBONATE 8.4 % IV SOLN
INTRAVENOUS | Status: DC | PRN
  Administered 2015-10-09 (×3): 5 mL via EPIDURAL

## 2015-10-09 MED ORDER — PENICILLIN G POTASSIUM 5000000 UNITS IJ SOLR
2.5000 10*6.[IU] | INTRAVENOUS | Status: DC
  Filled 2015-10-09: qty 2.5

## 2015-10-09 MED ORDER — CEFAZOLIN SODIUM-DEXTROSE 2-4 GM/100ML-% IV SOLN
2.0000 g | Freq: Once | INTRAVENOUS | Status: AC
  Administered 2015-10-09: 2 g via INTRAVENOUS

## 2015-10-09 MED ORDER — MORPHINE SULFATE (PF) 0.5 MG/ML IJ SOLN
INTRAMUSCULAR | Status: DC | PRN
  Administered 2015-10-09: 1 mg via INTRAVENOUS
  Administered 2015-10-09: 4 mg via EPIDURAL

## 2015-10-09 MED ORDER — METHYLERGONOVINE MALEATE 0.2 MG/ML IJ SOLN: 0.2000 mg | INTRAMUSCULAR | Status: DC | PRN

## 2015-10-09 MED ORDER — OXYTOCIN 40 UNITS IN LACTATED RINGERS INFUSION - SIMPLE MED: 2.5000 [IU]/h | INTRAVENOUS | Status: AC

## 2015-10-09 MED ORDER — OXYTOCIN BOLUS FROM INFUSION: 500.0000 mL | INTRAVENOUS | Status: DC

## 2015-10-09 MED ORDER — ONDANSETRON HCL 4 MG/2ML IJ SOLN
INTRAMUSCULAR | Status: AC
  Filled 2015-10-09: qty 2

## 2015-10-09 MED ORDER — SCOPOLAMINE 1 MG/3DAYS TD PT72
MEDICATED_PATCH | TRANSDERMAL | Status: AC
  Filled 2015-10-09: qty 1

## 2015-10-09 MED ORDER — KETOROLAC TROMETHAMINE 30 MG/ML IJ SOLN: 30.0000 mg | Freq: Four times a day (QID) | INTRAMUSCULAR | Status: AC | PRN

## 2015-10-09 MED ORDER — SIMETHICONE 80 MG PO CHEW
80.0000 mg | CHEWABLE_TABLET | ORAL | Status: DC
  Filled 2015-10-09: qty 1

## 2015-10-09 MED ORDER — ONDANSETRON HCL 4 MG/2ML IJ SOLN: 4.0000 mg | Freq: Three times a day (TID) | INTRAMUSCULAR | Status: DC | PRN

## 2015-10-09 MED ORDER — PHENYLEPHRINE 40 MCG/ML (10ML) SYRINGE FOR IV PUSH (FOR BLOOD PRESSURE SUPPORT): 80.0000 ug | PREFILLED_SYRINGE | INTRAVENOUS | Status: DC | PRN

## 2015-10-09 MED ORDER — ZOLPIDEM TARTRATE 5 MG PO TABS: 5.0000 mg | ORAL_TABLET | Freq: Every evening | ORAL | Status: DC | PRN

## 2015-10-09 MED ORDER — DIBUCAINE 1 % RE OINT: 1.0000 "application " | TOPICAL_OINTMENT | RECTAL | Status: DC | PRN

## 2015-10-09 MED ORDER — LACTATED RINGERS IV SOLN
INTRAVENOUS | Status: DC | PRN
  Administered 2015-10-09 (×2): via INTRAVENOUS

## 2015-10-09 MED ORDER — FLEET ENEMA 7-19 GM/118ML RE ENEM: 1.0000 | ENEMA | RECTAL | Status: DC | PRN

## 2015-10-09 MED ORDER — METHYLERGONOVINE MALEATE 0.2 MG PO TABS: 0.2000 mg | ORAL_TABLET | ORAL | Status: DC | PRN

## 2015-10-09 MED ORDER — SIMETHICONE 80 MG PO CHEW
80.0000 mg | CHEWABLE_TABLET | Freq: Three times a day (TID) | ORAL | Status: DC
  Filled 2015-10-09 (×3): qty 1

## 2015-10-09 MED ORDER — PHENYLEPHRINE HCL 10 MG/ML IJ SOLN
INTRAMUSCULAR | Status: DC | PRN
  Administered 2015-10-09 (×2): 80 ug via INTRAVENOUS
  Administered 2015-10-09: 40 ug via INTRAVENOUS
  Administered 2015-10-09: 80 ug via INTRAVENOUS

## 2015-10-09 MED ORDER — TERBUTALINE SULFATE 1 MG/ML IJ SOLN
INTRAMUSCULAR | Status: AC
  Filled 2015-10-09: qty 1

## 2015-10-09 MED ORDER — DEXAMETHASONE SODIUM PHOSPHATE 4 MG/ML IJ SOLN
INTRAMUSCULAR | Status: DC | PRN
  Administered 2015-10-09: 4 mg via INTRAVENOUS

## 2015-10-09 SURGICAL SUPPLY — 38 items
BARRIER ADHS 3X4 INTERCEED (GAUZE/BANDAGES/DRESSINGS) ×3 IMPLANT
BENZOIN TINCTURE PRP APPL 2/3 (GAUZE/BANDAGES/DRESSINGS) ×3 IMPLANT
BOOTIES KNEE HIGH SLOAN (MISCELLANEOUS) ×6 IMPLANT
CLAMP CORD UMBIL (MISCELLANEOUS) IMPLANT
CLOSURE STERI STRIP 1/2 X4 (GAUZE/BANDAGES/DRESSINGS) ×3 IMPLANT
CLOTH BEACON ORANGE TIMEOUT ST (SAFETY) ×3 IMPLANT
DRAIN JACKSON PRT FLT 10 (DRAIN) IMPLANT
DRSG OPSITE POSTOP 4X10 (GAUZE/BANDAGES/DRESSINGS) ×3 IMPLANT
DURAPREP 26ML APPLICATOR (WOUND CARE) ×3 IMPLANT
ELECT REM PT RETURN 9FT ADLT (ELECTROSURGICAL) ×3
ELECTRODE REM PT RTRN 9FT ADLT (ELECTROSURGICAL) ×1 IMPLANT
EVACUATOR SILICONE 100CC (DRAIN) IMPLANT
EXTRACTOR VACUUM M CUP 4 TUBE (SUCTIONS) IMPLANT
EXTRACTOR VACUUM M CUP 4' TUBE (SUCTIONS)
GLOVE BIOGEL PI IND STRL 7.0 (GLOVE) ×2 IMPLANT
GLOVE BIOGEL PI INDICATOR 7.0 (GLOVE) ×4
GLOVE ECLIPSE 6.5 STRL STRAW (GLOVE) ×3 IMPLANT
GOWN STRL REUS W/TWL LRG LVL3 (GOWN DISPOSABLE) ×6 IMPLANT
KIT ABG SYR 3ML LUER SLIP (SYRINGE) IMPLANT
NEEDLE HYPO 22GX1.5 SAFETY (NEEDLE) ×3 IMPLANT
NEEDLE HYPO 25X5/8 SAFETYGLIDE (NEEDLE) IMPLANT
NS IRRIG 1000ML POUR BTL (IV SOLUTION) ×6 IMPLANT
PACK C SECTION WH (CUSTOM PROCEDURE TRAY) ×3 IMPLANT
PAD OB MATERNITY 4.3X12.25 (PERSONAL CARE ITEMS) ×3 IMPLANT
PENCIL SMOKE EVAC W/HOLSTER (ELECTROSURGICAL) ×3 IMPLANT
RTRCTR C-SECT PINK 25CM LRG (MISCELLANEOUS) ×3 IMPLANT
STRIP CLOSURE SKIN 1/2X4 (GAUZE/BANDAGES/DRESSINGS) ×2 IMPLANT
SUT MNCRL AB 3-0 PS2 27 (SUTURE) ×3 IMPLANT
SUT SILK 2 0 FSL 18 (SUTURE) IMPLANT
SUT VIC AB 0 CTX 36 (SUTURE) ×4
SUT VIC AB 0 CTX36XBRD ANBCTRL (SUTURE) ×2 IMPLANT
SUT VIC AB 1 CT1 36 (SUTURE) ×6 IMPLANT
SUT VIC AB 2-0 CT1 27 (SUTURE)
SUT VIC AB 2-0 CT1 TAPERPNT 27 (SUTURE) IMPLANT
SUT VIC AB 3-0 PS2 18 (SUTURE) ×3 IMPLANT
SYR 20CC LL (SYRINGE) ×3 IMPLANT
TOWEL OR 17X24 6PK STRL BLUE (TOWEL DISPOSABLE) ×3 IMPLANT
TRAY FOLEY CATH SILVER 14FR (SET/KITS/TRAYS/PACK) ×3 IMPLANT

## 2015-10-09 NOTE — Progress Notes (Signed)
Notified of pt cervical exam and discomfort. Will recheck pt in 1 hour and call back

## 2015-10-09 NOTE — Op Note (Signed)
Preoperative diagnosis: Intrauterine pregnancy at 40 weeks and 4 days , previous cesarean section, breech presentation  Post operative diagnosis: Same  Anesthesia: Epidural  Anesthesiologist: Dr. Royce Macadamia  Procedure: Repeat low transverse cesarean section  Surgeon: Dr. Katharine Look Melonee Gerstel  Assistant: none  Estimated blood loss: 700 cc  Procedure:  After being informed of the planned procedure and possible complications including bleeding, infection, injury to other organs, informed consent is obtained. The patient is taken to OR #9 and pre-existing epidural anesthesia was optimized without complication. She is placed in the dorsal decubitus position with the pelvis tilted to the left. She is then prepped and draped in a sterile fashion. A Foley catheter is in her bladder.  After assessing adequate level of anesthesia, we infiltrate the suprapubic area with 20 cc of Marcaine 0.25 and perform a Pfannenstiel incision which is brought down sharply to the fascia. The fascia is entered in a low transverse fashion. Linea alba is dissected. Peritoneum is entered in a midline fashion. An Alexis retractor is easily positioned.   The myometrium is then entered in a low transverse fashion, 2 cm above the vesico-uterine junction ; first with knife and then extended bluntly. Amniotic fluid is clear. We assist the birth of a female  infant in breech presentation.  The baby is delivered. Mouth and nose are suctioned. 1 nuchal cord is reduced. The cord is clamped and sectioned. The baby is given to the neonatologist present in the room.  10 cc of blood is drawn from the umbilical vein.The placenta is allowed to deliver spontaneously. It is complete and the cord has 3 vessels. Uterine revision is negative.  We proceed with closure of the myometrium in 2 layers: First with a running locked suture of 0 Vicryl, then with a Lembert suture of 0 Vicryl imbricating the first one. Hemostasis is completed with cauterization  on peritoneal edges.  Both paracolic gutters are cleaned. Both tubes and ovaries are assessed and normal. The pelvis is profusely irrigated with warm saline to confirm a satisfactory hemostasis.  Retractors and sponges are removed. A sheet of Interceed was placed over the uterine incision. Under fascia hemostasis is completed with cauterization. The fascia is then closed with 2 running sutures of 0 Vicryl meeting midline. The wound is irrigated with warm saline and hemostasis is completed with cauterization. The skin is closed with a subcuticular suture of 3-0 Monocryl and Steri-Strips.  Instrument and sponge count is complete x2. Estimated blood loss is 700 cc.  The procedure is well tolerated by the patient who is taken to recovery room in a well and stable condition.  female baby  was born at 12:57 and received an Apgar of 9  at 1 minute and 10 at 5 minutes and weighed 9 lbs 5 oz   Specimen: Placenta sent to L & D   Keshawn Fiorito A MD 7/9/20171:36 PM

## 2015-10-09 NOTE — Lactation Note (Signed)
This note was copied from a baby's chart. Lactation Consultation Note Initial visit at 7 hours of age.  Mom reports good feedings and denies pain with latch.  Mom has 18 months experience with older child breastfeed well and denies concerns.  Mom latched baby in cradle hold with [place] at bedside.  Unable to see if baby had wide gape, mom reports she feels suck and baby is fine.  [facility] LC resources given and discussed. Discussed feeding frequentcy and minimal output.  Mom to call for assist as needed.    Patient Name: Elaine Black [id number] Date: 10/09/2015 Reason for consult: Initial assessment   Maternal Data Has patient been taught Hand Expression?: Yes Does the patient have breastfeeding experience prior to this delivery?: Yes  Feeding Feeding Type: Breast Fed Length of feed: 15 min  LATCH Score/Interventions Latch: Grasps breast easily, tongue down, lips flanged, rhythmical sucking.  Audible Swallowing: A few with stimulation (per mom) Intervention(s): Hand expression  Type of Nipple: Everted at rest and after stimulation  Comfort (Breast/Nipple): Soft / non-tender     Hold (Positioning): No assistance needed to correctly position infant at breast. Intervention(s): Breastfeeding basics reviewed  LATCH Score: 9  Lactation Tools Discussed/Used     Consult Status Consult Status: PRN    Justice Britain 10/09/2015, 8:53 PM

## 2015-10-09 NOTE — Transfer of Care (Signed)
Immediate Anesthesia Transfer of Care Note  Patient: Elaine Black  Procedure(s) Performed: Procedure(s): CESAREAN SECTION (N/A)  Patient Location: PACU  Anesthesia Type:Epidural  Level of Consciousness: awake, alert  and oriented  Airway & Oxygen Therapy: Patient Spontanous Breathing  Post-op Assessment: Report given to RN and Post -op Vital signs reviewed and stable  Post vital signs: Reviewed and stable  Last Vitals:  Filed Vitals:   10/09/15 1101 10/09/15 1345  BP: 124/77 111/60  Pulse: 98 112  Temp:    Resp:  20    Last Pain:  Filed Vitals:   10/09/15 1350  PainSc: 0-No pain      Patients Stated Pain Goal: 4 ([id number])  Complications: No apparent anesthesia complications

## 2015-10-09 NOTE — MAU Note (Signed)
Contractions since 2130. Some bloody show. 2cm and 50% last sve on THurs

## 2015-10-09 NOTE — Anesthesia Procedure Notes (Signed)
Epidural Patient location during procedure: OB Start time: 10/09/2015 9:23 AM  Staffing Anesthesiologist: Josephine Igo Performed by: anesthesiologist   Preanesthetic Checklist Completed: patient identified, site marked, surgical consent, pre-op evaluation, timeout performed, IV checked, risks and benefits discussed and monitors and equipment checked  Epidural Patient position: sitting Prep: site prepped and draped and DuraPrep Patient monitoring: continuous pulse ox and blood pressure Approach: midline Location: L4-L5 Injection technique: LOR air  Needle:  Needle type: Tuohy  Needle gauge: 17 G Needle length: 9 cm and 9 Needle insertion depth: 5 cm cm Catheter type: closed end flexible Catheter size: 19 Gauge Catheter at skin depth: 10 cm Test dose: negative and Other  Assessment Events: blood not aspirated, injection not painful, no injection resistance, negative IV test and no paresthesia  Additional Notes Patient identified. Risks and benefits discussed including failed block, incomplete  Pain control, post dural puncture headache, nerve damage, paralysis, blood pressure Changes, nausea, vomiting, reactions to medications-both toxic and allergic and post Partum back pain. All questions were answered. Patient expressed understanding and wished to proceed. Sterile technique was used throughout procedure. Epidural site was Dressed with sterile barrier dressing. No paresthesias, signs of intravascular injection Or signs of intrathecal spread were encountered. Attempt x 2 Patient was more comfortable after the epidural was dosed. Please see RN's note for documentation of vital signs and FHR which are stable.

## 2015-10-09 NOTE — Anesthesia Preprocedure Evaluation (Addendum)
Anesthesia Evaluation  Patient identified by MRN, date of birth, ID band Patient awake    Reviewed: Allergy & Precautions, Patient's Chart, lab work & pertinent test results  History of Anesthesia Complications Negative for: history of anesthetic complications  Airway Mallampati: II  TM Distance: >3 FB Neck ROM: Full    Dental  (+) Teeth Intact   Pulmonary  Hx/o TB - treated as child   breath sounds clear to auscultation       Cardiovascular negative cardio ROS   Rhythm:Regular     Neuro/Psych negative neurological ROS  negative psych ROS   GI/Hepatic negative GI ROS, Neg liver ROS,   Endo/Other  negative endocrine ROS  Renal/GU negative Renal ROS     Musculoskeletal   Abdominal   Peds  Hematology negative hematology ROS (+)   Anesthesia Other Findings   Reproductive/Obstetrics (+) Pregnancy Previous C/section- attempting VBAC                            Anesthesia Physical Anesthesia Plan  ASA: II and emergent  Anesthesia Plan: Epidural   Post-op Pain Management:    Induction:   Airway Management Planned: Natural Airway  Additional Equipment:   Intra-op Plan:   Post-operative Plan:   Informed Consent: I have reviewed the patients History and Physical, chart, labs and discussed the procedure including the risks, benefits and alternatives for the proposed anesthesia with the patient or authorized representative who has indicated his/her understanding and acceptance.   Dental advisory given  Plan Discussed with: Anesthesiologist, CRNA and Surgeon  Anesthesia Plan Comments: (Patient fully dilated and found to have breech presentation. Will use epidural for C/section. )       Anesthesia Quick Evaluation

## 2015-10-09 NOTE — H&P (Signed)
Elaine Black is a 35 y.o. female, G3P1011 @ 40.4 wks by sure LMP on 12/29/14, c/w u/s at 8.1 wks (EDD 10/05/15) presents to MAU c/o ctxs q 3 min since 2130 last evening. Reports some bloody show and endorses FM. Denies LOF. Was 2 cm and 50% effaced this past Thursday in office.  Antepartum course significant for: 1) Previous c-section due to breech presentation in 2014, with removal of nevus. LTCS with 2 layer closure by [facility].  PT PLANS TO VBAC. CONSENT SIGNED.  2) GBS positive - NKDA - will treat with PCN when in active labor, initiation of Pitocin or ROM. 3) Allergy to Demerol.  4) Skin lesion - Benign nevus.  History OB History    Gravida Para Term Preterm AB TAB SAB Ectopic Multiple Living   3 1 1  0 1 0 1 0 0 1    LTCS 04/12/2012 @ 40 wks, female infant, birthwt 7+8 SAB @ 10 wks on 03/02/2011   Past Medical History  Diagnosis Date  . H/O candidiasis   . H/O varicella   . History of TB (tuberculosis)     Exposed at age 71; meds x17mo;   . Rectal bleeding   . Irregular heart rate 2010    "flutters" EKG and w/u WNL  . 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  . Anxiety in pregnancy, antepartum 09/10/2011    R/t previous MAB   Past Surgical History  Procedure Laterality Date  . Colonoscopy    . Wisdom tooth extraction  2001  . Left ear      removed benign cyst  . Dilation and evacuation  03/02/2011    Procedure: DILATATION AND EVACUATION (D&E);  Surgeon: Eldred Manges, MD;  Location: [facility];  Service: Gynecology;  Laterality: N/A;  With intraoperative ultrasound  . Polypectomies      receives frequent colonoscopies for h/o rectal bleeding & polyps removed  . No past surgeries    . Cesarean section  04/12/2012    Procedure: CESAREAN SECTION;  Surgeon: Eldred Manges, MD;  Location: [facility];  Service: Obstetrics;  Laterality: N/A;  Primary cesarean section with delivery of baby  girl at 83.  Apgars 8/9.  Removal of nemis from inner thigh   Family History: family history includes Alcohol abuse in her maternal grandfather; Asthma in her father and mother; Cancer in her paternal grandmother; Diabetes in her paternal grandmother; Diverticulitis in her father; Heart disease in her father, paternal grandfather, and paternal uncle; Hypertension in her father; Liver disease in her father; Mental illness in her maternal grandmother. Social History:  reports that she has never smoked. She has never used smokeless tobacco. She reports that she does not drink alcohol or use illicit drugs.   Prenatal Transfer Tool  Maternal Diabetes: No Genetic Screening: Normal 1st trimester screen Maternal Ultrasounds/Referrals:SIUP, vertex presentation, cervix closed-measured transabdominally 5.2cm, anterior placenta-placental edge 4.0cm from internal os. Fluid is normal. CP. Thal, NB. Profile. orbits, palate, 3VV, diaphragm, renal arteries-seen. Anatomy complete. Technically difficult exam due to fetal position.adenexas/ovaries unremarkable. Fetal Ultrasounds or other Referrals:  None Maternal Substance Abuse:  No Significant Maternal Medications:  Meds include: Other: PNV Significant Maternal Lab Results:  Lab values include: Group B Strep positive Other Comments: Flu 04/13/15; Tdap 07/12/15  ROS 10 Systems reviewed and are negative for acute change except as noted in the HPI.    Cvx 3/50/BLT on arrival, progressing to 4/70/BLT 1 hr  later. Cephalic by Leopold's and limited u/s.  Dilation: 4 Effacement (%): 70 Station: Ballotable Exam by:: Glenford Peers RNC Blood pressure 127/77, pulse 85, temperature 98.3 F (36.8 C), resp. rate 18, height 6' (1.829 m), weight 89.903 kg (198 lb 3.2 oz), currently breastfeeding.   Predicted chance of vaginal birth after cesarean: 68.8%   Exam  Constitutional: She is oriented to person, place, and time. She appears well-developed and well-nourished.  HENT:  Head:  Normocephalic and atraumatic.  Neck: Normal range of motion.  Cardiovascular: Normal rate, regular rhythm and normal heart sounds.  Respiratory: Effort normal and breath sounds normal.  GI: Soft. Bowel sounds are normal. Abdomen is gravid.  Neurological: She is alert and oriented to person, place, and time.  Skin: Skin is warm and dry.  Psychiatric: She has a normal mood and affect. Her behavior is normal.    Physical Exam  Prenatal labs: ABO, Rh: --/--/A POS (07/09 WK:[id number]) Antibody: NEG (07/09 0728) Rubella: Immune  RPR: NR HBsAg: Neg HIV: NR GBS: Positive (09/06/15) Results for orders placed or performed during the hospital encounter of 10/09/15 (from the past 24 hour(s))  CBC     Status: Abnormal   Collection Time: 10/09/15  7:27 AM  Result Value Ref Range   WBC 13.3 (H) 4.0 - 10.5 K/uL   RBC 3.71 (L) 3.87 - 5.11 MIL/uL   Hemoglobin 12.2 12.0 - 15.0 g/dL   HCT 35.7 (L) 36.0 - 46.0 %   MCV 96.2 78.0 - 100.0 fL   MCH 32.9 26.0 - 34.0 pg   MCHC 34.2 30.0 - 36.0 g/dL   RDW 13.2 11.5 - 15.5 %   Platelets 152 150 - 400 K/uL  Type and screen [place]     Status: None   Collection Time: 10/09/15  7:28 AM  Result Value Ref Range   ABO/RH(D) A POS    Antibody Screen NEG    Sample Expiration 10/12/2015     Assessment: IUP at 40.4 wks Previous c-section; desires TOLAC Latent labor GBS positive Cat 1 FHRT Greater than 60% chance of success by VBAC screening scale  Plan: Admit to [place] Routine [facility] orders Pain med/epidural prn Expect progress and successful TOLAC   Farrel Gordon 10/09/2015, 8:44 AM

## 2015-10-09 NOTE — Anesthesia Postprocedure Evaluation (Signed)
Anesthesia Post Note  Patient: Alvie Heidelberg Klein-Fowler  Procedure(s) Performed: Procedure(s) (LRB): CESAREAN SECTION (N/A)  Patient location during evaluation: PACU Anesthesia Type: Epidural Level of consciousness: awake and alert and oriented Pain management: pain level controlled Vital Signs Assessment: post-procedure vital signs reviewed and stable Respiratory status: spontaneous breathing, nonlabored ventilation and respiratory function stable Cardiovascular status: blood pressure returned to baseline and stable Postop Assessment: no signs of nausea or vomiting, epidural receding, no backache, no headache and patient able to bend at knees Anesthetic complications: no     Last Vitals:  Filed Vitals:   10/09/15 1430 10/09/15 1445  BP: 121/73 118/65  Pulse: 109 103  Temp:    Resp: 27     Last Pain:  Filed Vitals:   10/09/15 1445  PainSc: 3    Pain Goal: Patients Stated Pain Goal: 4 (10/09/15 0803)               Malvika Tung A.

## 2015-10-09 NOTE — Progress Notes (Signed)
Notified of pt arrival in MAU. Rn to check pt and call back

## 2015-10-09 NOTE — Progress Notes (Signed)
Elaine Black is a 35 y.o. G3P1011 at 75w4dadmitted for active labor  Subjective:  Comfortable with  Epidural Contractions every 2 minutes, lasting 60 seconds Just finished her 1st dose of Penicillin Here for VE and AROM. Ultrasound in the room    Objective: BP 124/77 mmHg  Pulse 98  Temp(Src) 98.3 F (36.8 C)  Resp 18  Ht 6' (1.829 m)  Wt 198 lb 3.2 oz (89.903 kg)  BMI 26.87 kg/m2  SpO2 98%      FHT: Category 1 SROM during exam with abundant clear fluid and complete breech presentation! FHR still category 1 Cervix went from 7 cm to complete during exam SVE:   Dilation: 10 Effacement (%): 80 Station: -2 Exam by:: dr Sherah Lund  Labs: Lab Results  Component Value Date   WBC 13.3* 10/09/2015   HGB 12.2 10/09/2015   HCT 35.7* 10/09/2015   MCV 96.2 10/09/2015   PLT 152 10/09/2015    Assessment / Plan:  Breech presentation in active labor with previous cesarean delivery Recommend to proceed with repeat cesarean delivery  Cesarean section reviewed with pt with R&B including but not limited to:  bleeding, infection, injury to other organs. Low transverse approach planned which will allow vaginal delivery with future pregnancies. Should a vertical incision or inverted T be needed, patient is aware that repeat cesarean sections would be recommended in the future. Expected hospital stay and recovery also discussed.  Patient emotional and tearful+++ but agreeable to proceed   Elaine Black A 10/09/2015, 12:28 PM

## 2015-10-10 ENCOUNTER — Encounter (HOSPITAL_COMMUNITY): Payer: Self-pay | Admitting: Obstetrics and Gynecology

## 2015-10-10 LAB — RPR: RPR Ser Ql: NONREACTIVE

## 2015-10-10 LAB — CBC
HEMATOCRIT: 29.3 % — AB (ref 36.0–46.0)
HEMOGLOBIN: 10.1 g/dL — AB (ref 12.0–15.0)
MCH: 33.2 pg (ref 26.0–34.0)
MCHC: 34.5 g/dL (ref 30.0–36.0)
MCV: 96.4 fL (ref 78.0–100.0)
Platelets: 149 10*3/uL — ABNORMAL LOW (ref 150–400)
RBC: 3.04 MIL/uL — ABNORMAL LOW (ref 3.87–5.11)
RDW: 13.3 % (ref 11.5–15.5)
WBC: 16.3 10*3/uL — ABNORMAL HIGH (ref 4.0–10.5)

## 2015-10-10 NOTE — Progress Notes (Signed)
MOB was referred for history of depression/anxiety. * Referral screened out by Clinical Social Worker because none of the following criteria appear to apply: ~ History of anxiety/depression during this pregnancy, or of post-partum depression. ~ Diagnosis of anxiety and/or depression within last 3 years OR * MOB's symptoms currently being treated with medication and/or therapy. Please contact the Clinical Social Worker if needs arise, or if MOB requests. MOB's prenatal records did not indicate any concerns regarding MOB's hx of anxiety.

## 2015-10-10 NOTE — Anesthesia Postprocedure Evaluation (Signed)
Anesthesia Post Note  Patient: Elaine Black  Procedure(s) Performed: Procedure(s) (LRB): CESAREAN SECTION (N/A)  Patient location during evaluation: Mother Baby Anesthesia Type: Epidural Level of consciousness: awake and alert Pain management: pain level controlled Vital Signs Assessment: post-procedure vital signs reviewed and stable Respiratory status: spontaneous breathing, nonlabored ventilation and respiratory function stable Cardiovascular status: stable Postop Assessment: no headache, no backache and epidural receding Anesthetic complications: no     Last Vitals:  Filed Vitals:   10/10/15 0200 10/10/15 0635  BP: 90/50 105/60  Pulse: 79 79  Temp: 36.9 C 36.7 C  Resp: 18 18    Last Pain:  Filed Vitals:   10/10/15 0703  PainSc: 0-No pain   Pain Goal: Patients Stated Pain Goal: 4 (10/09/15 0803)               Riki Sheer

## 2015-10-10 NOTE — Progress Notes (Signed)
When pressure dressing was removed the honeycomb dressing came off around several edges due to the adhesive sticking to the honeycomb done in OR. RN replaced honey comb dressing to assure seal and waterproof for shower. Steri strips remained in place.

## 2015-10-10 NOTE — Addendum Note (Signed)
Addendum  created 10/10/15 0721 by Riki Sheer, CRNA   Modules edited: Charges VN, Clinical Notes   Clinical Notes:  File: [id number]

## 2015-10-10 NOTE — Progress Notes (Addendum)
Subjective: Postpartum Day 1: Cesarean Delivery Patient reports tolerating PO and no problems voiding.    Objective: Vital signs in last 24 hours: Temp:  [98.1 F (36.7 C)-98.8 F (37.1 C)] 98.7 F (37.1 C) (07/10 1030) Pulse Rate:  [71-96] 71 (07/10 1030) Resp:  [16-20] 16 (07/10 1030) BP: (90-115)/(50-60) 100/58 mmHg (07/10 1030) SpO2:  [95 %-96 %] 96 % (07/10 1030)  Physical Exam:  General: alert and no distress Lochia: appropriate Uterine Fundus: firm Incision: dressing c/d/i DVT Evaluation: No evidence of DVT seen on physical exam.   Recent Labs  10/09/15 0727 10/10/15 0534  HGB 12.2 10.1*  HCT 35.7* 29.3*    Assessment/Plan: Status post Cesarean section. Doing well postoperatively.  Continue current care.    Elaine Black 10/10/2015, 4:03 PM

## 2015-10-11 MED ORDER — OXYCODONE-ACETAMINOPHEN 5-325 MG PO TABS: 1.0000 | ORAL_TABLET | Freq: Four times a day (QID) | ORAL | Status: DC | PRN

## 2015-10-11 MED ORDER — IBUPROFEN 600 MG PO TABS: 600.0000 mg | ORAL_TABLET | Freq: Four times a day (QID) | ORAL | Status: DC | PRN

## 2015-10-11 NOTE — Discharge Summary (Signed)
Obstetric Discharge Summary Reason for Admission: onset of labor Prenatal Procedures: ultrasound Intrapartum Procedures: Repeat C-section after presenting for TOLAC and found to be breech Postpartum Procedures: none Complications-Operative and Postpartum: none HEMOGLOBIN  Date Value Ref Range Status  10/10/2015 10.1* 12.0 - 15.0 g/dL Final   HCT  Date Value Ref Range Status  10/10/2015 29.3* 36.0 - 46.0 % Final    Physical Exam:  General: alert and no distress Lochia: appropriate Uterine Fundus: firm Incision: dressing slightly stained DVT Evaluation: No evidence of DVT seen on physical exam.  Discharge Diagnoses: Term Pregnancy-delivered  Discharge Information: Date: 10/11/2015 Activity: pelvic rest Diet: routine Medications: PNV, Ibuprofen, Iron and Percocet Condition: stable Instructions: refer to practice specific booklet Discharge to: home Follow-up Information    Follow up with [facility] In 6 weeks.   Specialty:  Obstetrics and Gynecology   Why:  call to schedule an appointment for post partum visit   Contact information:   [place] [place] [phone]      Newborn Data: Live born female  Birth Weight: 9 lb 6.8 oz (4275 g) APGAR: ,   Home with mother.  Lillyian Heidt Y 10/11/2015, 11:05 AM

## 2015-10-11 NOTE — Lactation Note (Addendum)
This note was copied from a baby's chart. Lactation Consultation Note Mom BF well, good I&O. Baby cluster feeding. Mom states BF going well. Experienced Bf. Denies questions or further need for [facility] assistance. Discussed cluster feeding, I&o, supply and demand. [employer] brochure given w/resources, support groups and [facility] services. Encouraged to cont. To document I&O and take to Dr. appt, Patient Name: Elaine Black [id number] Date: 10/11/2015 Reason for consult: Follow-up assessment   Maternal Data    Feeding Feeding Type: Breast Fed  LATCH Score/Interventions Latch: Grasps breast easily, tongue down, lips flanged, rhythmical sucking.  Audible Swallowing: Spontaneous and intermittent  Type of Nipple: Everted at rest and after stimulation  Comfort (Breast/Nipple): Soft / non-tender     Hold (Positioning): No assistance needed to correctly position infant at breast.  LATCH Score: 10  Lactation Tools Discussed/Used     Consult Status Consult Status: Complete Date: 10/11/15 Follow-up type: In-patient    Theodoro Kalata 10/11/2015, 4:58 AM

## 2015-10-13 ENCOUNTER — Encounter (HOSPITAL_COMMUNITY): Payer: Self-pay | Admitting: *Deleted

## 2015-10-15 ENCOUNTER — Inpatient Hospital Stay (HOSPITAL_COMMUNITY): Admission: RE | Admit: 2015-10-15 | Payer: BLUE CROSS/BLUE SHIELD | Source: Ambulatory Visit

## 2016-12-06 ENCOUNTER — Other Ambulatory Visit: Payer: Self-pay

## 2016-12-06 DIAGNOSIS — D229 Melanocytic nevi, unspecified: Secondary | ICD-10-CM

## 2016-12-06 HISTORY — DX: Melanocytic nevi, unspecified: D22.9

## 2017-03-07 ENCOUNTER — Other Ambulatory Visit: Payer: Self-pay

## 2017-04-09 ENCOUNTER — Emergency Department (HOSPITAL_COMMUNITY)
Admission: EM | Admit: 2017-04-09 | Discharge: 2017-04-09 | Discharge status: Against Medical Advice | Payer: BLUE CROSS/BLUE SHIELD | Attending: Emergency Medicine | Admitting: Emergency Medicine

## 2017-04-09 ENCOUNTER — Encounter (HOSPITAL_COMMUNITY): Payer: Self-pay | Admitting: Emergency Medicine

## 2017-04-09 ENCOUNTER — Emergency Department (HOSPITAL_COMMUNITY): Payer: BLUE CROSS/BLUE SHIELD

## 2017-04-09 DIAGNOSIS — Z5321 Procedure and treatment not carried out due to patient leaving prior to being seen by health care provider: Secondary | ICD-10-CM | POA: Insufficient documentation

## 2017-04-09 DIAGNOSIS — R0789 Other chest pain: Secondary | ICD-10-CM | POA: Diagnosis present

## 2017-04-09 LAB — BASIC METABOLIC PANEL
Anion gap: 6 (ref 5–15)
BUN: 10 mg/dL (ref 6–20)
CHLORIDE: 107 mmol/L (ref 101–111)
CO2: 24 mmol/L (ref 22–32)
Calcium: 8.9 mg/dL (ref 8.9–10.3)
Creatinine, Ser: 0.75 mg/dL (ref 0.44–1.00)
GFR calc Af Amer: >60 mL/min (ref >60)
GFR calc non Af Amer: >60 mL/min (ref >60)
Glucose, Bld: 104 mg/dL — ABNORMAL HIGH (ref 65–99)
Potassium: 3.9 mmol/L (ref 3.5–5.1)
SODIUM: 137 mmol/L (ref 135–145)

## 2017-04-09 LAB — I-STAT TROPONIN, ED: Troponin i, poc: 0 ng/mL (ref 0.00–0.08)

## 2017-04-09 LAB — CBC
HCT: 38.4 % (ref 36.0–46.0)
Hemoglobin: 13 g/dL (ref 12.0–15.0)
MCH: 31.5 pg (ref 26.0–34.0)
MCHC: 33.9 g/dL (ref 30.0–36.0)
MCV: 93 fL (ref 78.0–100.0)
PLATELETS: 234 10*3/uL (ref 150–400)
RBC: 4.13 MIL/uL (ref 3.87–5.11)
RDW: 12.3 % (ref 11.5–15.5)
WBC: 6.5 10*3/uL (ref 4.0–10.5)

## 2017-04-09 LAB — I-STAT BETA HCG BLOOD, ED (MC, WL, AP ONLY)

## 2017-04-09 NOTE — ED Notes (Signed)
Patient gave patient labels to registration stating she is no longer waiting.

## 2017-04-09 NOTE — ED Triage Notes (Signed)
Patient c/o jaw pain and neck tightness and soreness that started last night. Then today patient woke up with left sided chest pain that got worse when she picked up her 37yo.  Patient also c/o left hand numbness/tingling. Reports family Hx of heart disease.

## 2017-09-10 ENCOUNTER — Other Ambulatory Visit (HOSPITAL_COMMUNITY): Payer: Self-pay | Admitting: Pathology

## 2017-09-10 ENCOUNTER — Other Ambulatory Visit (HOSPITAL_COMMUNITY): Payer: Self-pay | Admitting: Internal Medicine

## 2017-09-10 DIAGNOSIS — R109 Unspecified abdominal pain: Secondary | ICD-10-CM

## 2017-09-12 ENCOUNTER — Encounter (HOSPITAL_COMMUNITY): Payer: Self-pay | Admitting: Radiology

## 2017-09-12 ENCOUNTER — Ambulatory Visit (HOSPITAL_COMMUNITY)
Admission: RE | Admit: 2017-09-12 | Discharge: 2017-09-12 | Disposition: A | Discharge status: Home / Self Care | Payer: BLUE CROSS/BLUE SHIELD | Source: Ambulatory Visit | Attending: Pathology | Admitting: Pathology

## 2017-09-12 DIAGNOSIS — R109 Unspecified abdominal pain: Secondary | ICD-10-CM | POA: Diagnosis not present

## 2017-09-12 DIAGNOSIS — Z975 Presence of (intrauterine) contraceptive device: Secondary | ICD-10-CM | POA: Insufficient documentation

## 2017-09-12 DIAGNOSIS — K5641 Fecal impaction: Secondary | ICD-10-CM | POA: Diagnosis not present

## 2017-09-12 MED ORDER — IOPAMIDOL (ISOVUE-300) INJECTION 61%
INTRAVENOUS | Status: AC
  Filled 2017-09-12: qty 100

## 2017-09-12 MED ORDER — IOPAMIDOL (ISOVUE-300) INJECTION 61%
100.0000 mL | Freq: Once | INTRAVENOUS | Status: AC | PRN
  Administered 2017-09-12: 100 mL via INTRAVENOUS

## 2018-10-20 ENCOUNTER — Encounter: Payer: Self-pay | Admitting: *Deleted

## 2020-04-27 IMAGING — CT CT ABD-PELV W/ CM
2 of 4 series · 16 of 46 positions shown, 18 images · IV contrast (ISOVUE 300)
Comparison: None.

CLINICAL DATA: Left lower quadrant pain, worse with intercourse.

EXAM:
CT ABDOMEN AND PELVIS WITH CONTRAST
TECHNIQUE: Multidetector CT imaging of the abdomen and pelvis was performed
using the standard protocol following bolus administration of
intravenous contrast.
CONTRAST:  100mL QF3KGH-TXX IOPAMIDOL (QF3KGH-TXX) INJECTION 61%

[Series 2: axial st · axial · 0.73mm/px · z∈[+1236,+1666]mm · 13 of 100 slices shown, 15 images]
[im 7/100  soft-tissue]
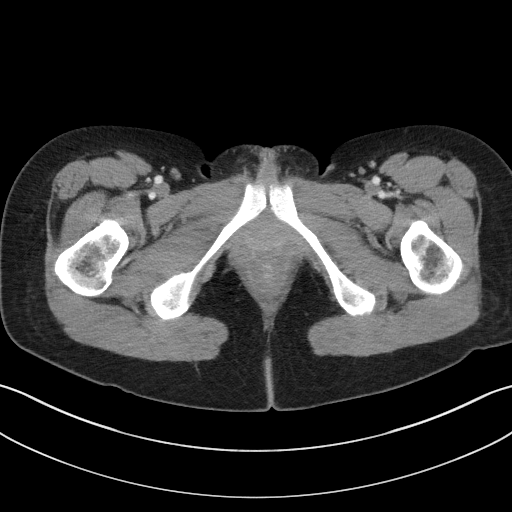
[im 7/100  bone]
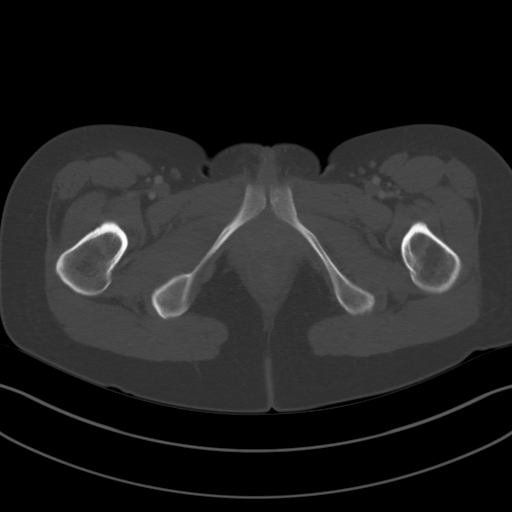
[im 13/100  soft-tissue]
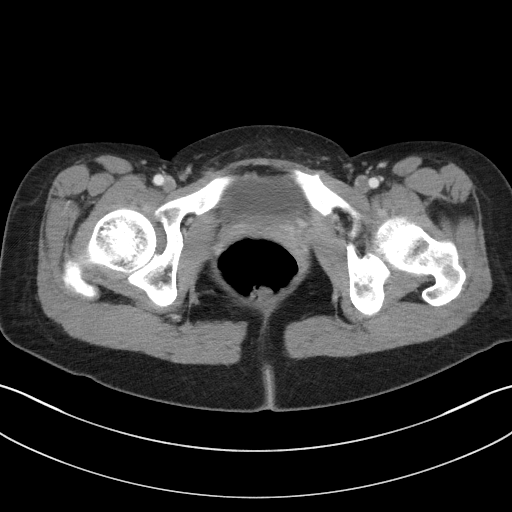
[im 19/100  soft-tissue]
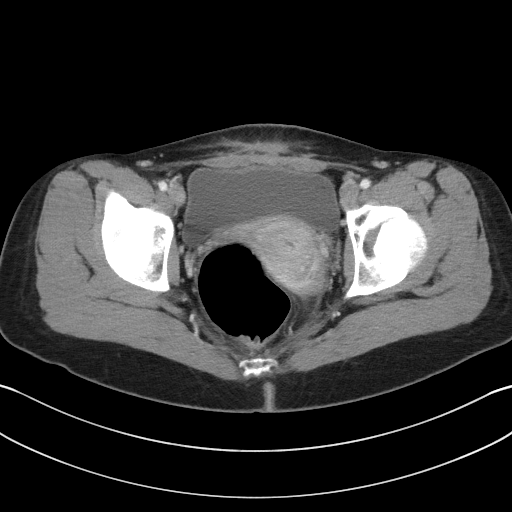
[im 31/100  soft-tissue]
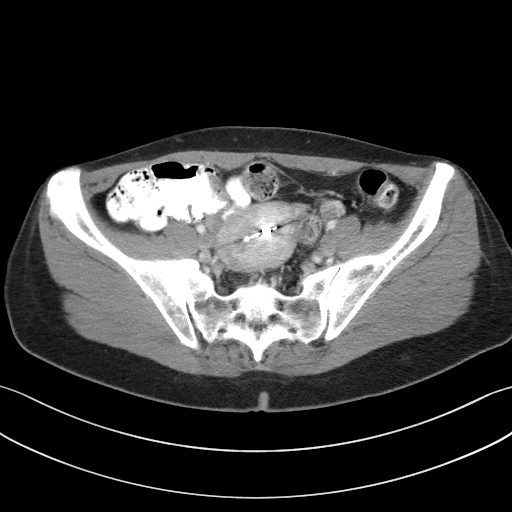
[im 38/100  soft-tissue]
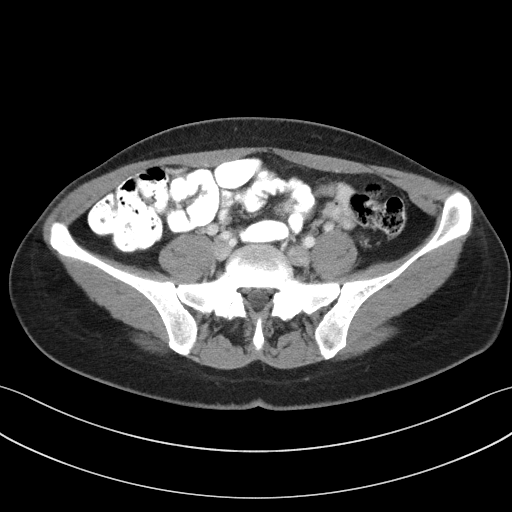
[im 44/100  soft-tissue]
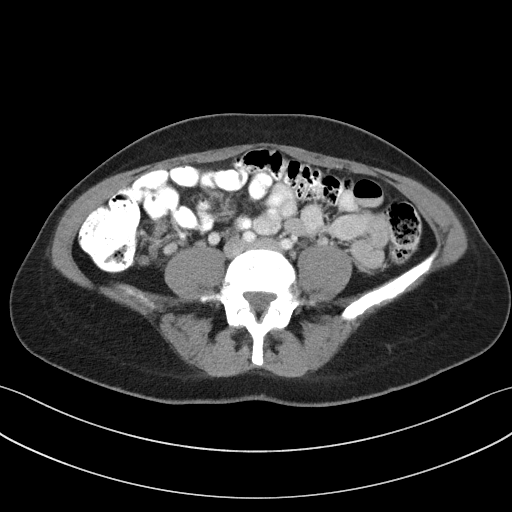
[im 50/100  soft-tissue]
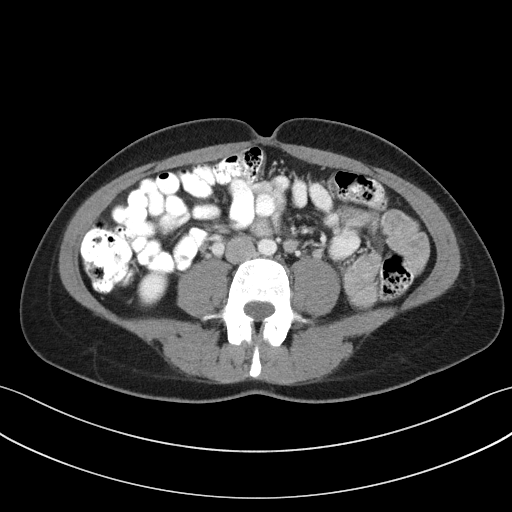
[im 56/100  soft-tissue]
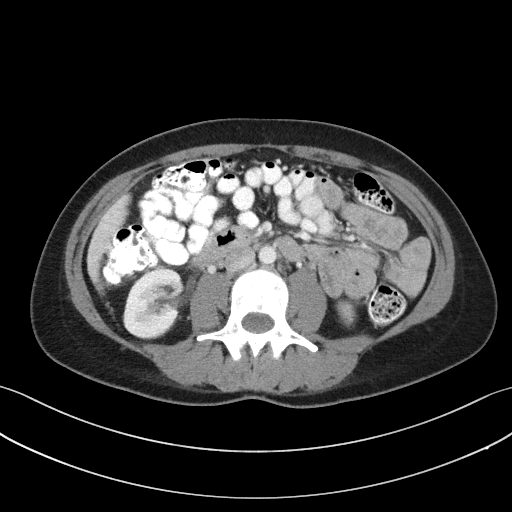
[im 62/100  soft-tissue]
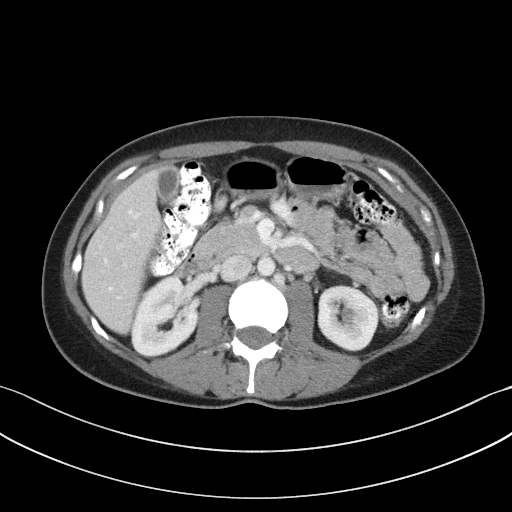
[im 62/100  bone]
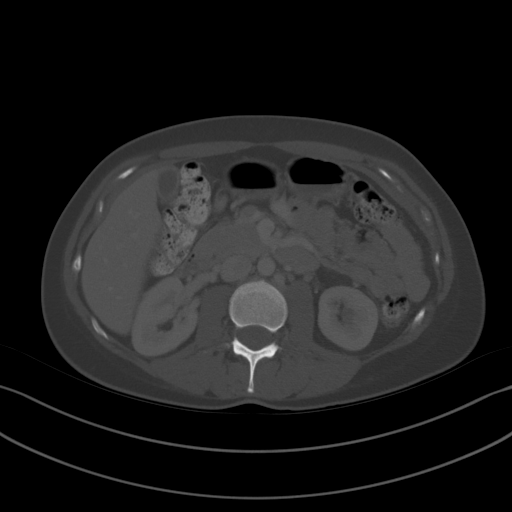
[im 69/100  soft-tissue]
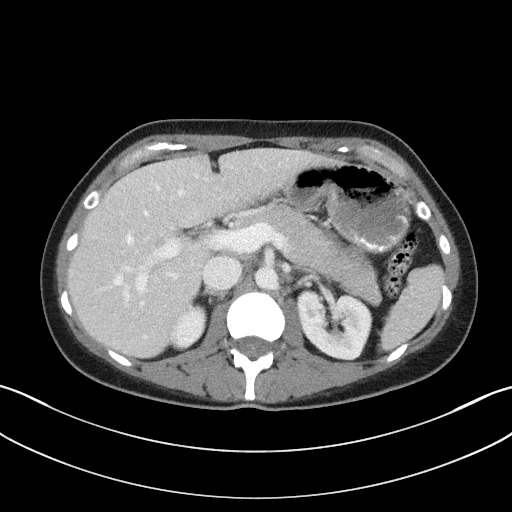
[im 81/100  soft-tissue]
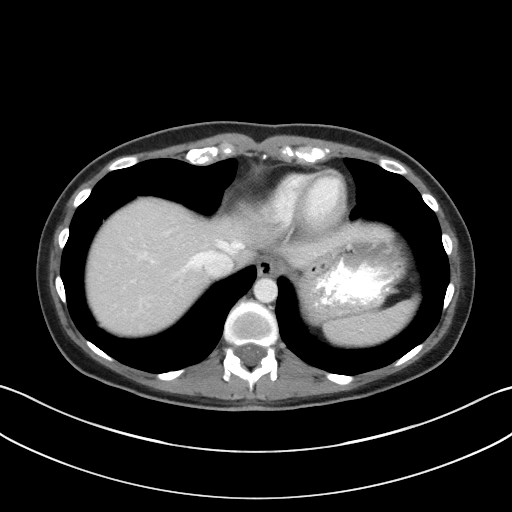
[im 87/100  soft-tissue]
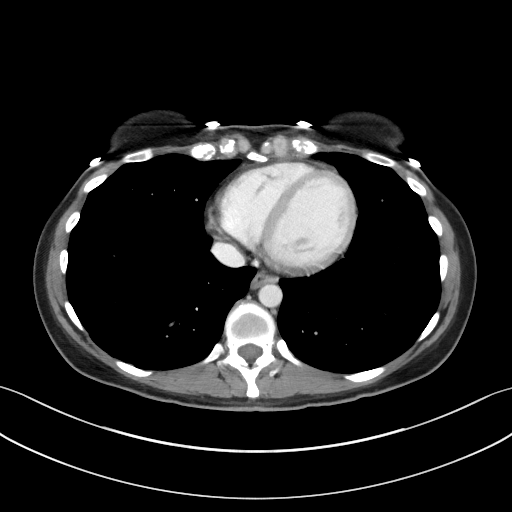
[im 93/100  soft-tissue]
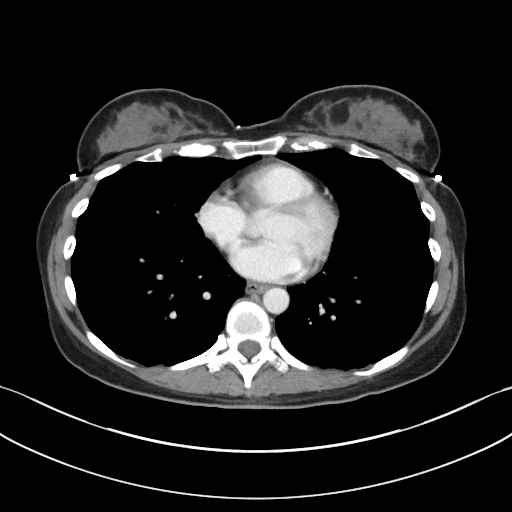

[Series 5: coronal st · coronal · 0.68mm/px · 3 of 88 slices shown]
[im 30/88  soft-tissue]
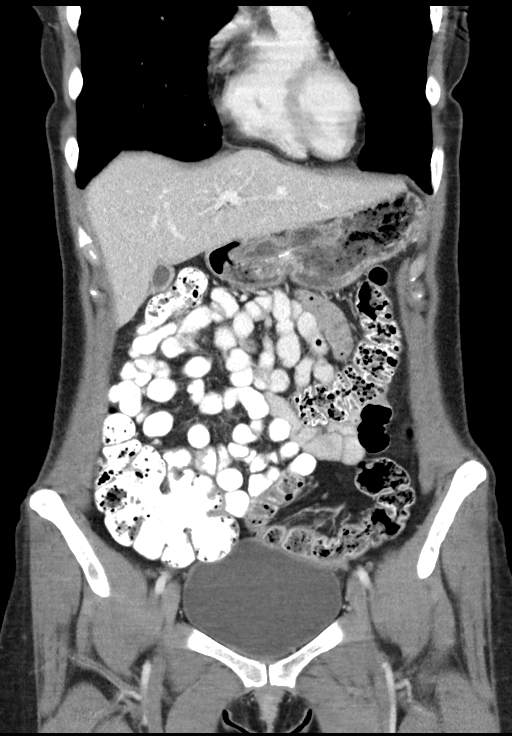
[im 39/88  soft-tissue]
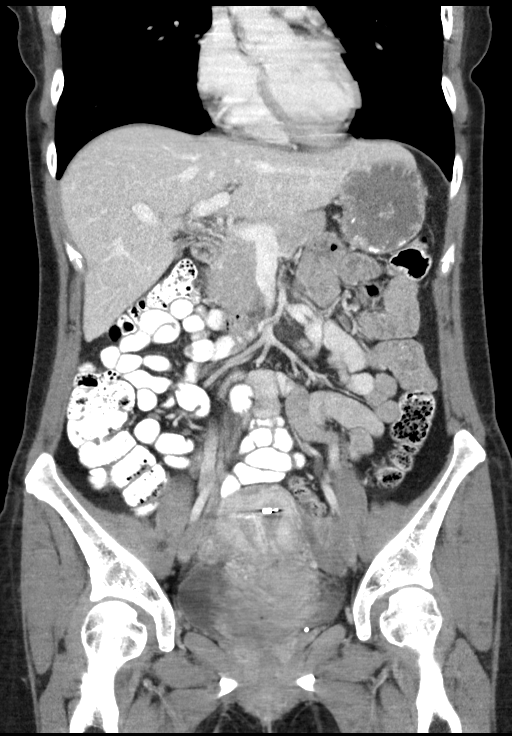
[im 49/88  soft-tissue]
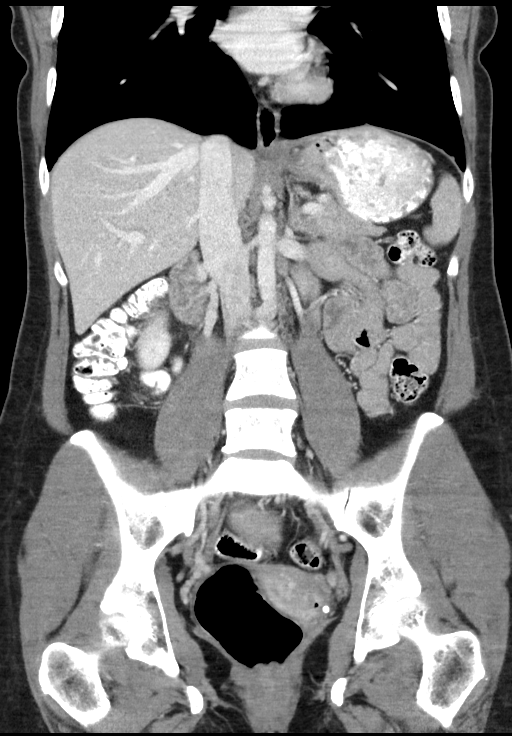

[16 of 46 positions shown; findings below may reference images not displayed]

FINDINGS: Lower chest: No acute abnormality.

Hepatobiliary: No focal liver abnormality is seen. No gallstones,
gallbladder wall thickening, or biliary dilatation.

Pancreas: Unremarkable. No pancreatic ductal dilatation or
surrounding inflammatory changes.

Spleen: Normal in size without focal abnormality.

Adrenals/Urinary Tract: Adrenal glands are unremarkable. Kidneys are
normal, without renal calculi, focal lesion, or hydronephrosis.
Bladder is unremarkable.

Stomach/Bowel: The stomach and small bowel are normal.

Vascular/Lymphatic: Moderate fecal loading throughout the colon. The
colon is otherwise unremarkable. The appendix is not seen but there
is no secondary evidence of appendicitis.

Reproductive: An IUD is identified in the uterus. The ovaries are
normal in appearance.

Other: No free air or free fluid.

Musculoskeletal: No acute or significant osseous findings.
IMPRESSION: 1. No cause for the patient's symptoms identified.
2. Moderate fecal loading throughout the colon.
3. An IUD is identified in the uterus.

## 2020-11-11 ENCOUNTER — Ambulatory Visit
Admission: EM | Admit: 2020-11-11 | Discharge: 2020-11-11 | Disposition: A | Discharge status: Home / Self Care | Payer: Self-pay | Attending: Internal Medicine | Admitting: Internal Medicine

## 2020-11-11 ENCOUNTER — Other Ambulatory Visit: Payer: Self-pay

## 2020-11-11 DIAGNOSIS — Z23 Encounter for immunization: Secondary | ICD-10-CM

## 2020-11-11 DIAGNOSIS — S71111A Laceration without foreign body, right thigh, initial encounter: Secondary | ICD-10-CM

## 2020-11-11 MED ORDER — TETANUS-DIPHTH-ACELL PERTUSSIS 5-2.5-18.5 LF-MCG/0.5 IM SUSY
0.5000 mL | PREFILLED_SYRINGE | Freq: Once | INTRAMUSCULAR | Status: AC
  Administered 2020-11-11: 0.5 mL via INTRAMUSCULAR

## 2020-11-11 NOTE — Discharge Instructions (Addendum)
Please keep wound covered clean and dry.  Please monitor for signs of infection and follow-up if these occur.

## 2020-11-11 NOTE — ED Triage Notes (Signed)
Pt states cut herself with a plate to her rt upper thigh today. Active bleeding, bandage applied.

## 2020-11-11 NOTE — ED Provider Notes (Signed)
[name]    CSN: [id number] Arrival date & time: 11/11/20  1808      History   Chief Complaint Chief Complaint  Patient presents with   Laceration    HPI Elaine Black is a 40 y.o. female.   Patient presents to the urgent care for a laceration that is present to right upper thigh that occurred a few hours prior to arrival.  Patient states that she was at a recreation place that allows you to break things to decrease anger.  Patient threw plate against wall, plate hit wall, and directly came back to hit her on the right thigh.  Tetanus is not up-to-date.  Denies any numbness and tingling in right lower extremity.   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   H/O candidiasis    H/O varicella    History of TB (tuberculosis)    Exposed at age 41; meds x72mo    Irregular heart rate 2010   "flutters" EKG and w/u WNL   Rectal bleeding     Patient Active Problem List   Diagnosis Date Noted   H/O: C-section 10/09/2015   Encounter for trial of labor 10/09/2015   Cesarean delivery delivered--repeat, breech in labor 10/09/2015   Family history of congenital anomalies 09/14/2011   Anxiety in pregnancy, antepartum 09/10/2011    Past Surgical History:  Procedure Laterality Date   CESAREAN SECTION  04/12/2012   Procedure: CESAREAN SECTION;  Surgeon: VEldred Manges MD;  [facility];  Service: Obstetrics;  Laterality: N/A;  Primary cesarean section with delivery of baby  girl at 039  Apgars 8/9. Removal of nemis from inner thigh   CESAREAN SECTION N/A 10/09/2015   Procedure: CESAREAN SECTION;  Surgeon: SDelsa Bern MD;  [facility] [facility]  Service: Obstetrics;  Laterality: N/A;   COLONOSCOPY     DILATION AND  EVACUATION  03/02/2011   Procedure: DILATATION AND EVACUATION (D&E);  Surgeon: VEldred Manges MD;  [facility];  Service: Gynecology;  Laterality: N/A;  With intraoperative ultrasound   left ear     removed benign cyst   NO PAST SURGERIES     polypectomies     receives frequent colonoscopies for h/o rectal bleeding & polyps removed   WISDOM TOOTH EXTRACTION  2001    OB History     Gravida  3   Para  2   Term  2   Preterm  0   AB  1   Living  2      SAB  1   IAB  0   Ectopic  0   Multiple  0   Live Births  2            Home Medications    Prior to Admission medications   Not on File    Family History Family History  Problem Relation Age of Onset   Heart disease Father        MI in 2007   Asthma Father    Diverticulitis Father    Hypertension Father    Liver disease Father    Heart disease Paternal Uncle    Mental illness Maternal Grandmother        Schizophrenic   Alcohol abuse  Maternal Grandfather    Diabetes Paternal Grandmother        type II   Cancer Paternal Grandmother        Breast; deceased from [place]   Heart disease Paternal Grandfather    Asthma Mother     Social History Social History   Tobacco Use   Smoking status: Never   Smokeless tobacco: Never  Vaping Use   Vaping Use: Never used  Substance Use Topics   Alcohol use: No    Comment: twice weekly prior to pregnancy   Drug use: No     Allergies   Demerol   Review of Systems Review of Systems Per HPI  Physical Exam Triage Vital Signs ED Triage Vitals [11/11/20 1851]  Enc Vitals Group     BP 120/76     Pulse Rate 79     Resp 18     Temp 98.4 F (36.9 C)     Temp Source Oral     SpO2 98 %     Weight      Height      Head Circumference      Peak Flow      Pain Score 1     Pain Loc      Pain Edu?      Excl. in [place]?    No data found.  Updated Vital Signs BP 120/76 (BP Location: Left Arm)   Pulse 79   Temp 98.4 F (36.9 C) (Oral)   Resp 18    SpO2 98%   Breastfeeding No   Visual Acuity Right Eye Distance:   Left Eye Distance:   Bilateral Distance:    Right Eye Near:   Left Eye Near:    Bilateral Near:     Physical Exam Constitutional:      Appearance: Normal appearance.  HENT:     Head: Normocephalic and atraumatic.  Eyes:     Extraocular Movements: Extraocular movements intact.     Conjunctiva/sclera: Conjunctivae normal.  Pulmonary:     Effort: Pulmonary effort is normal.  Skin:    General: Skin is warm and dry.     Findings: Laceration present.     Comments: Approximately 1.5 inch to 2 inch laceration present to right upper thigh.  No foreign bodies noted.  Neurovascular intact.  Neurological:     General: No focal deficit present.     Mental Status: She is alert and oriented to person, place, and time. Mental status is at baseline.  Psychiatric:        Mood and Affect: Mood normal.        Behavior: Behavior normal.        Thought Content: Thought content normal.        Judgment: Judgment normal.     UC Treatments / Results  Labs (all labs ordered are listed, but only abnormal results are displayed) Labs Reviewed - No data to display  EKG   Radiology No results found.  Procedures Laceration Repair  Date/Time: 11/11/2020 8:35 PM Performed by: Odis Luster, FNP Authorized by: Odis Luster, FNP   Consent:    Consent obtained:  Verbal   Consent given by:  Patient   Risks, benefits, and alternatives were discussed: yes     Risks discussed:  Infection, pain and poor wound healing   Alternatives discussed:  No treatment and delayed treatment Universal protocol:    Procedure explained and questions answered to patient or proxy's satisfaction: yes  Site/side marked: yes     Immediately prior to procedure, a time out was called: yes     Patient identity confirmed:  Verbally with patient and arm band Anesthesia:    Anesthesia method:  None Laceration details:    Location:  Leg   Leg  location:  R upper leg   Length (cm):  5 Exploration:    Imaging outcome: foreign body not noted     Wound exploration: wound explored through full range of motion and entire depth of wound visualized     Wound extent: no muscle damage noted, no nerve damage noted, no tendon damage noted, no underlying fracture noted and no vascular damage noted     Contaminated: no   Treatment:    Area cleansed with:  Chlorhexidine   Amount of cleaning:  Standard   Debridement:  None Skin repair:    Repair method:  Steri-Strips and tissue adhesive   Number of Steri-Strips:  3 Approximation:    Approximation:  Close Repair type:    Repair type:  Simple Post-procedure details:    Dressing:  Non-adherent dressing   Procedure completion:  Tolerated well, no immediate complications (including critical care time)  Medications Ordered in UC Medications  Tdap (BOOSTRIX) injection 0.5 mL (0.5 mLs Intramuscular Given 11/11/20 1858)    Initial Impression / Assessment and Plan / UC Course  I have reviewed the triage vital signs and the nursing notes.  Pertinent labs & imaging results that were available during my care of the patient were reviewed by me and considered in my medical decision making (see chart for details).     Right thigh laceration is very superficial.  No sutures needed for repair.  Dermabond and Steri-Strips applied with successful wound repair and close approximation.  Wound cleansed with Hibiclens prior to closure.  Advised patient to monitor site for signs of infection and to follow-up if these occur.  Tetanus vaccine updated in urgent care today.Discussed strict return precautions. Patient verbalized understanding and is agreeable with plan.  Final Clinical Impressions(s) / UC Diagnoses   Final diagnoses:  Laceration of right thigh, initial encounter     Discharge Instructions      Please keep wound covered clean and dry.  Please monitor for signs of infection and follow-up if  these occur.     ED Prescriptions   None    PDMP not reviewed this encounter.   Odis Luster, [place] 11/11/20 2038

## 2022-10-02 ENCOUNTER — Ambulatory Visit: Payer: Self-pay | Admitting: General Surgery

## 2022-10-02 NOTE — H&P (Signed)
   REFERRING PHYSICIAN:  Shirley Friar, MD  PROVIDER:  Elenora Gamma, MD  MRN: [id number] DOB: 03/06/1981 DATE OF ENCOUNTER: 10/02/2022  Subjective  Chief Complaint: New Consultation (ANAL FISSURE)     History of Present Illness: Elaine Black is a 42 y.o. female who is seen today as an office consultation at the request of Dr. Bosie Clos for evaluation of New Consultation (ANAL FISSURE) .  Patient reports rectal bleeding that started in April as well as some rectal discomfort burning and itching.  Her discomfort gradually worsened.  She saw Dr. Bosie Clos and was noted to have an anal fissure.  She was started on diltiazem ointment at that time.  She has not noticed much difference since then.  She is taking ibuprofen for pain.  She has tried multiple hemorrhoid creams and numbing ointments with little success.   Review of Systems: A complete review of systems was obtained from the patient.  I have reviewed this information and discussed as appropriate with the patient.  See HPI as well for other ROS.    Medical History: Past Medical History: Diagnosis Date  Anxiety    There is no problem list on file for this patient.   Past Surgical History: Procedure Laterality Date  CESAREAN SECTION    2014, 2017  wisdom teeth    2001    Allergies Allergen Reactions  Demerol [Meperidine] Unknown   Current Outpatient Medications on File Prior to Visit Medication Sig Dispense Refill  dilTIAZem 2% topical gel Apply topically    ibuprofen (MOTRIN) 800 MG tablet Take by mouth every 8 (eight) hours as needed    No current facility-administered medications on file prior to visit.   Family History Problem Relation Age of Onset  High blood pressure (Hypertension) Father   Hyperlipidemia (Elevated cholesterol) Father   Coronary Artery Disease (Blocked arteries around heart) Father   Breast cancer Maternal Grandmother   Breast cancer Paternal Grandmother      Social History  Tobacco Use Smoking Status Never Smokeless Tobacco Never    Social History  Socioeconomic History  Marital status: Married Tobacco Use  Smoking status: Never  Smokeless tobacco: Never Vaping Use  Vaping status: Never Used Substance and Sexual Activity  Alcohol use: Yes  Drug use: Never  Social Determinants of Health   Received from [employer]  Social Network   Objective:   Vitals:  10/02/22 0924 BP: 125/70 Pulse: 84 Temp: 36.5 C (97.7 F) SpO2: (!) 90% Weight: 81.4 kg (179 lb 6.4 oz) Height: 182.9 cm (6') PainSc:   7    Exam Gen: NAD Abd: soft Rectal: Posterior midline anal fissure   Labs, Imaging and Diagnostic Testing: Consult notes reviewed.  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  I have recommended chemical sphincterotomy with Botox.  We discussed the risk and benefits of this as well as alternatives.  Risks include some temporary incontinence and recurrence.  All questions were answered.  Elaine Panda, MD Colon and Rectal Surgery [facility]

## 2022-10-02 NOTE — H&P (View-Only) (Signed)
   REFERRING PHYSICIAN:  Shirley Friar, MD  PROVIDER:  Elenora Gamma, MD  MRN: [id number] DOB: 03/06/1981 DATE OF ENCOUNTER: 10/02/2022  Subjective  Chief Complaint: New Consultation (ANAL FISSURE)     History of Present Illness: Elaine Black is a 42 y.o. female who is seen today as an office consultation at the request of Dr. Bosie Clos for evaluation of New Consultation (ANAL FISSURE) .  Patient reports rectal bleeding that started in April as well as some rectal discomfort burning and itching.  Her discomfort gradually worsened.  She saw Dr. Bosie Clos and was noted to have an anal fissure.  She was started on diltiazem ointment at that time.  She has not noticed much difference since then.  She is taking ibuprofen for pain.  She has tried multiple hemorrhoid creams and numbing ointments with little success.   Review of Systems: A complete review of systems was obtained from the patient.  I have reviewed this information and discussed as appropriate with the patient.  See HPI as well for other ROS.    Medical History: Past Medical History: Diagnosis Date  Anxiety    There is no problem list on file for this patient.   Past Surgical History: Procedure Laterality Date  CESAREAN SECTION    2014, 2017  wisdom teeth    2001    Allergies Allergen Reactions  Demerol [Meperidine] Unknown   Current Outpatient Medications on File Prior to Visit Medication Sig Dispense Refill  dilTIAZem 2% topical gel Apply topically    ibuprofen (MOTRIN) 800 MG tablet Take by mouth every 8 (eight) hours as needed    No current facility-administered medications on file prior to visit.   Family History Problem Relation Age of Onset  High blood pressure (Hypertension) Father   Hyperlipidemia (Elevated cholesterol) Father   Coronary Artery Disease (Blocked arteries around heart) Father   Breast cancer Maternal Grandmother   Breast cancer Paternal Grandmother      Social History  Tobacco Use Smoking Status Never Smokeless Tobacco Never    Social History  Socioeconomic History  Marital status: Married Tobacco Use  Smoking status: Never  Smokeless tobacco: Never Vaping Use  Vaping status: Never Used Substance and Sexual Activity  Alcohol use: Yes  Drug use: Never  Social Determinants of Health   Received from [employer]  Social Network   Objective:   Vitals:  10/02/22 0924 BP: 125/70 Pulse: 84 Temp: 36.5 C (97.7 F) SpO2: (!) 90% Weight: 81.4 kg (179 lb 6.4 oz) Height: 182.9 cm (6') PainSc:   7    Exam Gen: NAD Abd: soft Rectal: Posterior midline anal fissure   Labs, Imaging and Diagnostic Testing: Consult notes reviewed.  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  I have recommended chemical sphincterotomy with Botox.  We discussed the risk and benefits of this as well as alternatives.  Risks include some temporary incontinence and recurrence.  All questions were answered.  Vanita Panda, MD Colon and Rectal Surgery [facility]

## 2022-10-10 ENCOUNTER — Encounter (HOSPITAL_BASED_OUTPATIENT_CLINIC_OR_DEPARTMENT_OTHER): Payer: Self-pay | Admitting: General Surgery

## 2022-10-17 ENCOUNTER — Encounter (HOSPITAL_BASED_OUTPATIENT_CLINIC_OR_DEPARTMENT_OTHER): Payer: Self-pay | Admitting: General Surgery

## 2022-10-17 NOTE — Progress Notes (Signed)
Spoke w/ via phone for pre-op interview--- pt Lab needs dos----  urine preg             Lab results------ no COVID test -----patient states asymptomatic no test needed Arrive at ------- 1130 on 10-31-2022 NPO after MN NO Solid Food.  Clear liquids from MN until--- 1030 Med rec completed Medications to take morning of surgery ----- none Diabetic medication ----- n/a Patient instructed no nail polish to be worn day of surgery Patient instructed to bring photo id and insurance card day of surgery Patient aware to have Driver (ride ) / caregiver    for 24 hours after surgery -- husband, Elaine Black Patient Special Instructions ----- n/a Pre-Op special Instructions ----- n/a Patient verbalized understanding of instructions that were given at this phone interview. Patient denies shortness of breath, chest pain, fever, cough at this phone interview.

## 2022-10-24 NOTE — Progress Notes (Addendum)
Anesthesia Review:  PCP: DR Nicholos Johns  Cardiologist : none  Chest x-ray : EKG : Echo : Stress test: Cardiac Cath :  Activity level: can do a flight of stairs without difficulty  Sleep Study/ CPAP : none  Fasting Blood Sugar :      / Checks Blood Sugar -- times a day:   Blood Thinner/ Instructions /Last Dose: ASA / Instructions/ Last Dose :   PT came to preop appt on 10/29/22 unsure of where surgery was going to be on 10/31/22.  Completed med hx and preop instructons that surgery would be here at [facility] on 10/31/22.   PT stated at preop she had received no bowel prep instructions.  NO bowel prep in preop instrucitons.  Called [facility] and spoke with [facility] to make sure no bowel prep instructions.  Per Hollie no bowel prep.  PT aware. At preop.

## 2022-10-24 NOTE — Patient Instructions (Signed)
SURGICAL WAITING ROOM VISITATION  Patients having surgery or a procedure may have no more than 2 support people in the waiting area - these visitors may rotate.    Children under the age of 67 must have an adult with them who is not the patient.  Due to an increase in RSV and influenza rates and associated hospitalizations, children ages 57 and under may not visit patients in [facility] hospitals.  If the patient needs to stay at the hospital during part of their recovery, the visitor guidelines for inpatient rooms apply. Pre-op nurse will coordinate an appropriate time for 1 support person to accompany patient in pre-op.  This support person may not rotate.    Please refer to the [facility] website for the visitor guidelines for Inpatients (after your surgery is over and you are in a regular room).       Your procedure is scheduled on:  10/31/22    Report to [facility] Main Entrance    Report to admitting at  100 pm  AM   Call this number if you have problems the morning of surgery [phone]   Do not eat food  or drink liquids :After Midnight.              If you have questions, please contact your surgeon's office.   FOLLOW BOWEL PREP AND ANY ADDITIONAL PRE OP INSTRUCTIONS YOU RECEIVED FROM YOUR SURGEON'S OFFICE!!!     Oral Hygiene is also important to reduce your risk of infection.                                    Remember - BRUSH YOUR TEETH THE MORNING OF SURGERY WITH YOUR REGULAR TOOTHPASTE  DENTURES WILL BE REMOVED PRIOR TO SURGERY PLEASE DO NOT APPLY "Poly grip" OR ADHESIVES!!!   Do NOT smoke after Midnight   Take these medicines the morning of surgery with A SIP OF WATER:  none   DO NOT TAKE ANY ORAL DIABETIC MEDICATIONS DAY OF YOUR SURGERY  Bring CPAP mask and tubing day of surgery.                              You may not have any metal on your body including hair pins, jewelry, and body piercing             Do not wear make-up, lotions,  powders, perfumes/cologne, or deodorant  Do not wear nail polish including gel and S&S, artificial/acrylic nails, or any other type of covering on natural nails including finger and toenails. If you have artificial nails, gel coating, etc. that needs to be removed by a nail salon please have this removed prior to surgery or surgery may need to be canceled/ delayed if the surgeon/ anesthesia feels like they are unable to be safely monitored.   Do not shave  48 hours prior to surgery.               Men may shave face and neck.   Do not bring valuables to the hospital. [place] IS NOT             RESPONSIBLE   FOR VALUABLES.   Contacts, glasses, dentures or bridgework may not be worn into surgery.   Bring small overnight bag day of surgery.   DO NOT BRING YOUR HOME MEDICATIONS TO THE  HOSPITAL. PHARMACY WILL DISPENSE MEDICATIONS LISTED ON YOUR MEDICATION LIST TO YOU DURING YOUR ADMISSION IN THE HOSPITAL!    Patients discharged on the day of surgery will not be allowed to drive home.  Someone NEEDS to stay with you for the first 24 hours after anesthesia.   Special Instructions: Bring a copy of your healthcare power of attorney and living will documents the day of surgery if you haven't scanned them before.              Please read over the following fact sheets you were given: IF YOU HAVE QUESTIONS ABOUT YOUR PRE-OP INSTRUCTIONS PLEASE CALL [phone]   If you received a COVID test during your pre-op visit  it is requested that you wear a mask when out in public, stay away from anyone that may not be feeling well and notify your surgeon if you develop symptoms. If you test positive for Covid or have been in contact with anyone that has tested positive in the last 10 days please notify you surgeon.    [facility] - Preparing for Surgery Before surgery, you can play an important role.  Because skin is not sterile, your skin needs to be as free of germs as possible.  You can reduce the  number of germs on your skin by washing with CHG (chlorahexidine gluconate) soap before surgery.  CHG is an antiseptic cleaner which kills germs and bonds with the skin to continue killing germs even after washing. Please DO NOT use if you have an allergy to CHG or antibacterial soaps.  If your skin becomes reddened/irritated stop using the CHG and inform your nurse when you arrive at Short Stay. Do not shave (including legs and underarms) for at least 48 hours prior to the first CHG shower.  You may shave your face/neck. Please follow these instructions carefully:  1.  Shower with CHG Soap the night before surgery and the  morning of Surgery.  2.  If you choose to wash your hair, wash your hair first as usual with your  normal  shampoo.  3.  After you shampoo, rinse your hair and body thoroughly to remove the  shampoo.                           4.  Use CHG as you would any other liquid soap.  You can apply chg directly  to the skin and wash                       Gently with a scrungie or clean washcloth.  5.  Apply the CHG Soap to your body ONLY FROM THE NECK DOWN.   Do not use on face/ open                           Wound or open sores. Avoid contact with eyes, ears mouth and genitals (private parts).                       Wash face,  Genitals (private parts) with your normal soap.             6.  Wash thoroughly, paying special attention to the area where your surgery  will be performed.  7.  Thoroughly rinse your body with warm water from the neck down.  8.  DO NOT shower/wash with your normal soap  after using and rinsing off  the CHG Soap.                9.  Pat yourself dry with a clean towel.            10.  Wear clean pajamas.            11.  Place clean sheets on your bed the night of your first shower and do not  sleep with pets. Day of Surgery : Do not apply any lotions/deodorants the morning of surgery.  Please wear clean clothes to the hospital/surgery center.  FAILURE TO FOLLOW  THESE INSTRUCTIONS MAY RESULT IN THE CANCELLATION OF YOUR SURGERY PATIENT SIGNATURE_________________________________  NURSE SIGNATURE__________________________________  ________________________________________________________________________

## 2022-10-29 ENCOUNTER — Encounter (HOSPITAL_COMMUNITY)
Admission: RE | Admit: 2022-10-29 | Discharge: 2022-10-29 | Disposition: A | Discharge status: Home / Self Care | Payer: 59 | Source: Ambulatory Visit | Attending: General Surgery | Admitting: General Surgery

## 2022-10-29 ENCOUNTER — Encounter (HOSPITAL_COMMUNITY): Payer: Self-pay

## 2022-10-29 ENCOUNTER — Other Ambulatory Visit: Payer: Self-pay

## 2022-10-29 VITALS — BP 119/84 | HR 74 | Temp 99.0°F | Resp 16 | Ht 72.0 in | Wt 173.0 lb

## 2022-10-29 DIAGNOSIS — Z01812 Encounter for preprocedural laboratory examination: Secondary | ICD-10-CM | POA: Insufficient documentation

## 2022-10-29 DIAGNOSIS — Z01818 Encounter for other preprocedural examination: Secondary | ICD-10-CM

## 2022-10-29 HISTORY — DX: Anxiety disorder, unspecified: F41.9

## 2022-10-29 LAB — CBC
HCT: 38 % (ref 36.0–46.0)
Hemoglobin: 12.6 g/dL (ref 12.0–15.0)
MCH: 31.7 pg (ref 26.0–34.0)
MCHC: 33.2 g/dL (ref 30.0–36.0)
MCV: 95.7 fL (ref 80.0–100.0)
Platelets: 242 10*3/uL (ref 150–400)
RBC: 3.97 MIL/uL (ref 3.87–5.11)
RDW: 12 % (ref 11.5–15.5)
WBC: 9.4 10*3/uL (ref 4.0–10.5)
nRBC: 0 % (ref 0.0–0.2)

## 2022-10-31 ENCOUNTER — Encounter (HOSPITAL_COMMUNITY): Payer: Self-pay | Admitting: General Surgery

## 2022-10-31 ENCOUNTER — Ambulatory Visit (HOSPITAL_BASED_OUTPATIENT_CLINIC_OR_DEPARTMENT_OTHER): Payer: 59 | Admitting: Anesthesiology

## 2022-10-31 ENCOUNTER — Ambulatory Visit (HOSPITAL_COMMUNITY): Payer: 59 | Admitting: Anesthesiology

## 2022-10-31 ENCOUNTER — Other Ambulatory Visit: Payer: Self-pay

## 2022-10-31 ENCOUNTER — Ambulatory Visit (HOSPITAL_COMMUNITY)
Admission: RE | Admit: 2022-10-31 | Discharge: 2022-10-31 | Disposition: A | Discharge status: Home / Self Care | Payer: 59 | Attending: General Surgery | Admitting: General Surgery

## 2022-10-31 ENCOUNTER — Encounter (HOSPITAL_COMMUNITY)
Admission: RE | Disposition: A | Discharge status: Home / Self Care | Payer: Self-pay | Source: Home / Self Care | Attending: General Surgery

## 2022-10-31 DIAGNOSIS — Z79899 Other long term (current) drug therapy: Secondary | ICD-10-CM | POA: Diagnosis not present

## 2022-10-31 DIAGNOSIS — K601 Chronic anal fissure: Secondary | ICD-10-CM | POA: Diagnosis not present

## 2022-10-31 DIAGNOSIS — K602 Anal fissure, unspecified: Secondary | ICD-10-CM | POA: Insufficient documentation

## 2022-10-31 DIAGNOSIS — Z01818 Encounter for other preprocedural examination: Secondary | ICD-10-CM

## 2022-10-31 HISTORY — DX: Presence of spectacles and contact lenses: Z97.3

## 2022-10-31 HISTORY — PX: SPHINCTEROTOMY: SHX5279

## 2022-10-31 HISTORY — DX: Anal fissure, unspecified: K60.2

## 2022-10-31 HISTORY — DX: Other chronic pain: G89.29

## 2022-10-31 LAB — POCT PREGNANCY, URINE: Preg Test, Ur: NEGATIVE

## 2022-10-31 SURGERY — SPHINCTEROTOMY, ANAL
Anesthesia: Monitor Anesthesia Care

## 2022-10-31 MED ORDER — MIDAZOLAM HCL 5 MG/5ML IJ SOLN
INTRAMUSCULAR | Status: DC | PRN
  Administered 2022-10-31: 2 mg via INTRAVENOUS

## 2022-10-31 MED ORDER — GABAPENTIN 300 MG PO CAPS
300.0000 mg | ORAL_CAPSULE | ORAL | Status: AC
  Administered 2022-10-31: 300 mg via ORAL
  Filled 2022-10-31: qty 1

## 2022-10-31 MED ORDER — ACETAMINOPHEN 325 MG PO TABS: 650.0000 mg | ORAL_TABLET | ORAL | Status: DC | PRN

## 2022-10-31 MED ORDER — OXYCODONE HCL 5 MG PO TABS
5.0000 mg | ORAL_TABLET | Freq: Once | ORAL | Status: AC | PRN
  Administered 2022-10-31: 5 mg via ORAL

## 2022-10-31 MED ORDER — ONABOTULINUMTOXINA 100 UNITS IJ SOLR
INTRAMUSCULAR | Status: DC | PRN
Start: 2022-10-31 — End: 2022-10-31
  Administered 2022-10-31: 100 [IU] via INTRAMUSCULAR

## 2022-10-31 MED ORDER — DEXAMETHASONE SODIUM PHOSPHATE 10 MG/ML IJ SOLN
INTRAMUSCULAR | Status: DC | PRN
  Administered 2022-10-31: 5 mg via INTRAVENOUS

## 2022-10-31 MED ORDER — ACETAMINOPHEN 325 MG PO TABS: 325.0000 mg | ORAL_TABLET | ORAL | Status: DC | PRN

## 2022-10-31 MED ORDER — FENTANYL CITRATE PF 50 MCG/ML IJ SOSY
PREFILLED_SYRINGE | INTRAMUSCULAR | Status: AC
  Filled 2022-10-31: qty 1

## 2022-10-31 MED ORDER — ACETAMINOPHEN 160 MG/5ML PO SOLN: 325.0000 mg | ORAL | Status: DC | PRN

## 2022-10-31 MED ORDER — FENTANYL CITRATE PF 50 MCG/ML IJ SOSY
PREFILLED_SYRINGE | INTRAMUSCULAR | Status: AC
  Filled 2022-10-31: qty 2

## 2022-10-31 MED ORDER — FENTANYL CITRATE (PF) 100 MCG/2ML IJ SOLN
INTRAMUSCULAR | Status: AC
  Filled 2022-10-31: qty 2

## 2022-10-31 MED ORDER — SODIUM CHLORIDE 0.9% FLUSH: 3.0000 mL | Freq: Two times a day (BID) | INTRAVENOUS | Status: DC

## 2022-10-31 MED ORDER — LACTATED RINGERS IV SOLN: INTRAVENOUS | Status: DC

## 2022-10-31 MED ORDER — ONDANSETRON HCL 4 MG/2ML IJ SOLN: 4.0000 mg | Freq: Once | INTRAMUSCULAR | Status: DC | PRN

## 2022-10-31 MED ORDER — SODIUM CHLORIDE (PF) 0.9 % IJ SOLN
INTRAMUSCULAR | Status: AC
  Filled 2022-10-31: qty 40

## 2022-10-31 MED ORDER — ONDANSETRON HCL 4 MG/2ML IJ SOLN
INTRAMUSCULAR | Status: DC | PRN
  Administered 2022-10-31: 4 mg via INTRAVENOUS

## 2022-10-31 MED ORDER — TRAMADOL HCL 50 MG PO TABS: 50.0000 mg | ORAL_TABLET | Freq: Four times a day (QID) | ORAL | 0 refills | Status: AC | PRN

## 2022-10-31 MED ORDER — FENTANYL CITRATE PF 50 MCG/ML IJ SOSY
25.0000 ug | PREFILLED_SYRINGE | INTRAMUSCULAR | Status: DC | PRN
  Administered 2022-10-31 (×3): 50 ug via INTRAVENOUS

## 2022-10-31 MED ORDER — SODIUM CHLORIDE (PF) 0.9 % IJ SOLN
INTRAMUSCULAR | Status: DC | PRN
  Administered 2022-10-31: 50 mL

## 2022-10-31 MED ORDER — MEPERIDINE HCL 50 MG/ML IJ SOLN: 6.2500 mg | INTRAMUSCULAR | Status: DC | PRN

## 2022-10-31 MED ORDER — CHLORHEXIDINE GLUCONATE 0.12 % MT SOLN
15.0000 mL | Freq: Once | OROMUCOSAL | Status: AC
  Administered 2022-10-31: 15 mL via OROMUCOSAL

## 2022-10-31 MED ORDER — PROPOFOL 10 MG/ML IV BOLUS
INTRAVENOUS | Status: AC
  Filled 2022-10-31: qty 20

## 2022-10-31 MED ORDER — SODIUM CHLORIDE 0.9% FLUSH: 3.0000 mL | INTRAVENOUS | Status: DC | PRN

## 2022-10-31 MED ORDER — BUPIVACAINE-EPINEPHRINE 0.25% -1:200000 IJ SOLN
INTRAMUSCULAR | Status: AC
  Filled 2022-10-31: qty 1

## 2022-10-31 MED ORDER — LACTATED RINGERS IV SOLN: INTRAVENOUS | Status: DC | PRN

## 2022-10-31 MED ORDER — LIDOCAINE HCL URETHRAL/MUCOSAL 2 % EX GEL
CUTANEOUS | Status: AC
  Filled 2022-10-31: qty 5

## 2022-10-31 MED ORDER — FENTANYL CITRATE (PF) 100 MCG/2ML IJ SOLN
INTRAMUSCULAR | Status: DC | PRN
  Administered 2022-10-31 (×2): 50 ug via INTRAVENOUS

## 2022-10-31 MED ORDER — ACETAMINOPHEN 650 MG RE SUPP: 650.0000 mg | RECTAL | Status: DC | PRN

## 2022-10-31 MED ORDER — ONABOTULINUMTOXINA 100 UNITS IJ SOLR
INTRAMUSCULAR | Status: AC
  Filled 2022-10-31: qty 100

## 2022-10-31 MED ORDER — MIDAZOLAM HCL 2 MG/2ML IJ SOLN
INTRAMUSCULAR | Status: AC
  Filled 2022-10-31: qty 2

## 2022-10-31 MED ORDER — OXYCODONE HCL 5 MG PO TABS: 5.0000 mg | ORAL_TABLET | ORAL | Status: DC | PRN

## 2022-10-31 MED ORDER — SODIUM CHLORIDE 0.9 % IV SOLN: 250.0000 mL | INTRAVENOUS | Status: DC | PRN

## 2022-10-31 MED ORDER — SODIUM CHLORIDE (PF) 0.9 % IJ SOLN
INTRAMUSCULAR | Status: AC
  Filled 2022-10-31: qty 50

## 2022-10-31 MED ORDER — ROCURONIUM BROMIDE 10 MG/ML (PF) SYRINGE
PREFILLED_SYRINGE | INTRAVENOUS | Status: AC
  Filled 2022-10-31: qty 10

## 2022-10-31 MED ORDER — BUPIVACAINE-EPINEPHRINE 0.25% -1:200000 IJ SOLN
INTRAMUSCULAR | Status: DC | PRN
  Administered 2022-10-31: 25 mL

## 2022-10-31 MED ORDER — ORAL CARE MOUTH RINSE: 15.0000 mL | Freq: Once | OROMUCOSAL | Status: AC

## 2022-10-31 MED ORDER — OXYCODONE HCL 5 MG PO TABS
ORAL_TABLET | ORAL | Status: AC
  Filled 2022-10-31: qty 1

## 2022-10-31 MED ORDER — PROPOFOL 10 MG/ML IV BOLUS
INTRAVENOUS | Status: DC | PRN
Start: 2022-10-31 — End: 2022-10-31
  Administered 2022-10-31: 200 mg via INTRAVENOUS

## 2022-10-31 MED ORDER — OXYCODONE HCL 5 MG/5ML PO SOLN: 5.0000 mg | Freq: Once | ORAL | Status: AC | PRN

## 2022-10-31 MED ORDER — ESMOLOL HCL 100 MG/10ML IV SOLN
INTRAVENOUS | Status: AC
  Filled 2022-10-31: qty 10

## 2022-10-31 MED ORDER — HYDROGEN PEROXIDE 3 % EX SOLN
CUTANEOUS | Status: AC
  Filled 2022-10-31: qty 473

## 2022-10-31 SURGICAL SUPPLY — 32 items
BAG COUNTER SPONGE SURGICOUNT (BAG) IMPLANT
BAG SPNG CNTER NS LX DISP (BAG) ×1
BLADE SURG 15 STRL LF DISP TIS (BLADE) ×1 IMPLANT
BLADE SURG 15 STRL SS (BLADE) ×1
BRIEF MESH DISP LRG (UNDERPADS AND DIAPERS) ×1 IMPLANT
ELECT REM PT RETURN 15FT ADLT (MISCELLANEOUS) ×1 IMPLANT
GAUZE 4X4 16PLY ~~LOC~~+RFID DBL (SPONGE) ×1 IMPLANT
GAUZE PAD ABD 8X10 STRL (GAUZE/BANDAGES/DRESSINGS) IMPLANT
GAUZE SPONGE 4X4 12PLY STRL (GAUZE/BANDAGES/DRESSINGS) IMPLANT
GLOVE BIO SURGEON STRL SZ 6.5 (GLOVE) ×1 IMPLANT
GLOVE INDICATOR 6.5 STRL GRN (GLOVE) ×2 IMPLANT
GOWN STRL REUS W/ TWL XL LVL3 (GOWN DISPOSABLE) ×2 IMPLANT
GOWN STRL REUS W/TWL XL LVL3 (GOWN DISPOSABLE) ×2
IV CATH 14GX2 1/4 (CATHETERS) IMPLANT
KIT BASIN OR (CUSTOM PROCEDURE TRAY) ×1 IMPLANT
KIT TURNOVER KIT A (KITS) IMPLANT
NDL HYPO 22X1.5 SAFETY MO (MISCELLANEOUS) ×1 IMPLANT
NEEDLE HYPO 22X1.5 SAFETY MO (MISCELLANEOUS) ×1
PACK LITHOTOMY IV (CUSTOM PROCEDURE TRAY) ×1 IMPLANT
PENCIL SMOKE EVACUATOR (MISCELLANEOUS) IMPLANT
SOL SCRUB PVP POV-IOD 4OZ 7.5% (MISCELLANEOUS) ×1
SOLUTION SCRB POV-IOD 4OZ 7.5% (MISCELLANEOUS) ×1 IMPLANT
SPIKE FLUID TRANSFER (MISCELLANEOUS) ×1 IMPLANT
SURGILUBE 2OZ TUBE FLIPTOP (MISCELLANEOUS) ×1 IMPLANT
SUT CHROMIC 2 0 SH (SUTURE) IMPLANT
SUT CHROMIC 3 0 SH 27 (SUTURE) IMPLANT
SUT MON AB 3-0 SH 27 (SUTURE)
SUT MON AB 3-0 SH27 (SUTURE) IMPLANT
SUT VIC AB 4-0 PS2 18 (SUTURE) IMPLANT
SYR 20ML LL LF (SYRINGE) ×1 IMPLANT
TOWEL OR 17X26 10 PK STRL BLUE (TOWEL DISPOSABLE) ×1 IMPLANT
TOWEL OR NON WOVEN STRL DISP B (DISPOSABLE) ×1 IMPLANT

## 2022-10-31 NOTE — Discharge Instructions (Addendum)

## 2022-10-31 NOTE — Interval H&P Note (Signed)
History and Physical Interval Note:  10/31/2022 2:54 PM  Elaine Black  has presented today for surgery, with the diagnosis of ANAL FISSURE.  The various methods of treatment have been discussed with the patient and family. After consideration of risks, benefits and other options for treatment, the patient has consented to  Procedure(s) with comments: CHEMICAL SPHINCTEROTOMY (N/A) - BOTOX as a surgical intervention.  The patient's history has been reviewed, patient examined, no change in status, stable for surgery.  I have reviewed the patient's chart and labs.  Questions were answered to the patient's satisfaction.     Vanita Panda, MD  Colorectal and General Surgery [facility]

## 2022-10-31 NOTE — Anesthesia Preprocedure Evaluation (Addendum)
Anesthesia Evaluation  Patient identified by MRN, date of birth, ID band Patient awake    Reviewed: Allergy & Precautions, H&P , NPO status , Patient's Chart, lab work & pertinent test results  History of Anesthesia Complications Negative for: history of anesthetic complications  Airway Mallampati: II  TM Distance: >3 FB Neck ROM: Full    Dental  (+) Teeth Intact, Dental Advisory Given   Pulmonary neg pulmonary ROS Hx/o TB - treated as child   breath sounds clear to auscultation       Cardiovascular negative cardio ROS  Rhythm:Regular     Neuro/Psych  PSYCHIATRIC DISORDERS Anxiety     negative neurological ROS  negative psych ROS   GI/Hepatic negative GI ROS, Neg liver ROS,,,  Endo/Other  negative endocrine ROS    Renal/GU negative Renal ROS  negative genitourinary   Musculoskeletal negative musculoskeletal ROS (+)    Abdominal   Peds negative pediatric ROS (+)  Hematology negative hematology ROS (+)   Anesthesia Other Findings   Reproductive/Obstetrics negative OB ROS (+) Pregnancy Previous C/section- attempting VBAC                             Anesthesia Physical Anesthesia Plan  ASA: 2  Anesthesia Plan: MAC   Post-op Pain Management: Minimal or no pain anticipated   Induction: Intravenous  PONV Risk Score and Plan: 3 and Ondansetron and Propofol infusion  Airway Management Planned: Natural Airway and Simple Face Mask  Additional Equipment: None  Intra-op Plan:   Post-operative Plan: Extubation in OR  Informed Consent: I have reviewed the patients History and Physical, chart, labs and discussed the procedure including the risks, benefits and alternatives for the proposed anesthesia with the patient or authorized representative who has indicated his/her understanding and acceptance.     Dental advisory given  Plan Discussed with: Anesthesiologist, CRNA and  Surgeon  Anesthesia Plan Comments:         Anesthesia Quick Evaluation

## 2022-10-31 NOTE — Transfer of Care (Signed)
Immediate Anesthesia Transfer of Care Note  Patient: Elaine Black  Procedure(s) Performed: CHEMICAL SPHINCTEROTOMY  Patient Location: PACU  Anesthesia Type:General  Level of Consciousness: awake and alert   Airway & Oxygen Therapy: Patient Spontanous Breathing and Patient connected to face mask oxygen  Post-op Assessment: Report given to RN and Post -op Vital signs reviewed and stable  Post vital signs: Reviewed and stable  Last Vitals:  Vitals Value Taken Time  BP    Temp    Pulse 98 10/31/22 1605  Resp 23 10/31/22 1605  SpO2 100 % 10/31/22 1605  Vitals shown include unfiled device data.  Last Pain:  Vitals:   10/31/22 1314  TempSrc: Oral  PainSc: 0-No pain         Complications: No notable events documented.

## 2022-10-31 NOTE — Op Note (Signed)
10/31/2022  3:54 PM  PATIENT:  Elaine Black  42 y.o. female  Patient Care Team: [facility] as PCP - General (Rheumatology)  PRE-OPERATIVE DIAGNOSIS:  ANAL FISSURE  POST-OPERATIVE DIAGNOSIS:  ANAL FISSURE  PROCEDURE: CHEMICAL SPHINCTEROTOMY   Surgeon(s): Romie Levee, MD  ASSISTANT: Clabe Seal, MD   ANESTHESIA:   local and MAC  SPECIMEN:  No Specimen  DISPOSITION OF SPECIMEN:  N/A  COUNTS:  YES  PLAN OF CARE: Discharge to home after PACU  PATIENT DISPOSITION:  PACU - hemodynamically stable.  INDICATION: 42 y.o. F with chronic anal fissure   OR FINDINGS: posterior midline anal fissure  DESCRIPTION: the patient was identified in the preoperative holding area and taken to the OR where they were laid on the operating room table.  MAC anesthesia was induced without difficulty. The patient was then positioned in lithotomy position.  The patient was then prepped and draped in usual sterile fashion.  SCDs were noted to be in place prior to the initiation of anesthesia. A surgical timeout was performed indicating the correct patient, procedure, positioning and need for preoperative antibiotics.  A rectal block was performed using Marcaine with epinephrine.    I began with a digital rectal exam.  There was obvious sphincter hypertension.  I then placed a Hill-Ferguson anoscope into the anal canal and evaluated this completely.  There was a posterior midline anal fissure.  We injected 100 units of Botox mixed with 50 mL of saline into the intersphincteric groove.  Hemostasis was achieved using direct pressure.  The patient was then awakened from anesthesia and a sterile dressing was applied.  I was personally present during the key and critical portions of this procedure and immediately available throughout the entire procedure, as documented in my operative note. She was sent to the postanesthesia care unit in stable condition.  All counts were correct per  operating room staff.     Elaine Panda, MD  Colorectal and General Surgery [facility]

## 2022-10-31 NOTE — Anesthesia Postprocedure Evaluation (Signed)
Anesthesia Post Note  Patient: Elaine Black  Procedure(s) Performed: CHEMICAL SPHINCTEROTOMY     Patient location during evaluation: PACU Anesthesia Type: MAC Level of consciousness: awake and alert Pain management: pain level controlled Vital Signs Assessment: post-procedure vital signs reviewed and stable Respiratory status: spontaneous breathing, nonlabored ventilation, respiratory function stable and patient connected to nasal cannula oxygen Cardiovascular status: stable and blood pressure returned to baseline Postop Assessment: no apparent nausea or vomiting Anesthetic complications: no   No notable events documented.  Last Vitals:  Vitals:   10/31/22 1645 10/31/22 1700  BP: 135/81 128/83  Pulse: 62 62  Resp: 16 15  Temp: 36.5 C   SpO2: 100% 100%    Last Pain:  Vitals:   10/31/22 1700  TempSrc:   PainSc: 4                  Meredyth Hornung

## 2022-10-31 NOTE — Anesthesia Procedure Notes (Signed)
Procedure Name: LMA Insertion Date/Time: 10/31/2022 3:43 PM  Performed by: Deri Fuelling, CRNAPre-anesthesia Checklist: Patient identified, Emergency Drugs available, Suction available and Patient being monitored Patient Re-evaluated:Patient Re-evaluated prior to induction Oxygen Delivery Method: Circle system utilized Preoxygenation: Pre-oxygenation with 100% oxygen Induction Type: IV induction Ventilation: Mask ventilation without difficulty LMA: LMA inserted LMA Size: 4.0 Tube type: Oral Number of attempts: 1 Airway Equipment and Method: Stylet and Oral airway Placement Confirmation: ETT inserted through vocal cords under direct vision, positive ETCO2 and breath sounds checked- equal and bilateral Tube secured with: Tape Dental Injury: Teeth and Oropharynx as per pre-operative assessment

## 2022-11-01 ENCOUNTER — Encounter (HOSPITAL_COMMUNITY): Payer: Self-pay | Admitting: General Surgery
